# Patient Record
Sex: Male | Born: 1960 | Race: White | Hispanic: No | Smoking: Former smoker
Health system: Southern US, Community
[De-identification: ages and names within clinical notes are randomized; demographics above are authoritative.]

## PROBLEM LIST (undated history)

## (undated) DIAGNOSIS — J189 Pneumonia, unspecified organism: Secondary | ICD-10-CM

## (undated) DIAGNOSIS — M1990 Unspecified osteoarthritis, unspecified site: Secondary | ICD-10-CM

## (undated) DIAGNOSIS — G839 Paralytic syndrome, unspecified: Secondary | ICD-10-CM

## (undated) DIAGNOSIS — Z8489 Family history of other specified conditions: Secondary | ICD-10-CM

## (undated) DIAGNOSIS — I499 Cardiac arrhythmia, unspecified: Secondary | ICD-10-CM

## (undated) DIAGNOSIS — Z87442 Personal history of urinary calculi: Secondary | ICD-10-CM

## (undated) HISTORY — PX: FRACTURE SURGERY: SHX138

---
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
## 1981-07-08 HISTORY — PX: BACK SURGERY: SHX140

## 2019-01-06 DIAGNOSIS — I499 Cardiac arrhythmia, unspecified: Secondary | ICD-10-CM

## 2019-01-06 HISTORY — DX: Cardiac arrhythmia, unspecified: I49.9

## 2019-01-27 ENCOUNTER — Ambulatory Visit (INDEPENDENT_AMBULATORY_CARE_PROVIDER_SITE_OTHER): Payer: BC Managed Care – PPO | Admitting: Orthopaedic Surgery

## 2019-01-27 ENCOUNTER — Other Ambulatory Visit: Payer: Self-pay

## 2019-01-27 VITALS — Ht 70.75 in | Wt 242.0 lb

## 2019-01-27 DIAGNOSIS — M25551 Pain in right hip: Secondary | ICD-10-CM | POA: Diagnosis not present

## 2019-01-27 DIAGNOSIS — M1611 Unilateral primary osteoarthritis, right hip: Secondary | ICD-10-CM

## 2019-01-27 NOTE — Progress Notes (Signed)
Office Visit Note   Patient: Brandon Farley           Date of Birth: 11-24-1960           MRN: [id number] Visit Date: 01/27/2019              Requested by: No referring provider defined for this encounter. PCP: Patient, No Pcp Per   Assessment & Plan: Visit Diagnoses:  1. Pain of right hip joint   2. Unilateral primary osteoarthritis, right hip     Plan: Given his x-ray findings combined with his clinical exam I as well have recommended total hip arthroplasty.  I explained hip replacement through a direct anterior approach.  We talked about the risk and benefits of surgery.  I gave him a handout about the surgery.  Showed him a hip model and talked about his intraoperative and postoperative course.  We had a long and thorough discussion about this.  All question concerns were answered and addressed.  He said he would like our surgery scheduler's card and will give us a call when he would like us to hopefully have this rescheduled.  Follow-Up Instructions: Return if symptoms worsen or fail to improve.   Orders:  No orders of the defined types were placed in this encounter.  No orders of the defined types were placed in this encounter.     Procedures: No procedures performed   Clinical Data: No additional findings.   Subjective: Chief Complaint  Patient presents with   Right Hip - Pain    2nd opinion. Brought xray cd and records. Discuss anterior THA vs posterior approach.  The patient comes in today for second opinion as it relates to severe arthritis in his right hip.  He has been [place] in the state and was told he needed hip replacement.  That surgeon wanted to do this through a posterior approach.  However he would like to consider anterior hip surgery.  His pain in his right hip is daily and it is in the groin.  It is detriment affecting his mobility, his quality of life and his activities of daily living.  This is been worsening since he fell on that hip in  2018.  He is sought conservative treatment for a while but over a years worth of conservative treatment has not helped.  This includes hip strengthening exercises as well as therapy and an injection and anti-inflammatories.  He is work on weight loss and activity modification.  He does have x-rays that accompany him today as well as notes from the orthopedic surgeon in [facility] who did appropriately recommend hip placement surgery.  He does have a history of high blood pressure.  He denies smoking and is not a diabetic.  He is on blood pressure medication.  HPI  Review of Systems He currently denies any headache, chest pain, shortness of breath, fever, chills, nausea, vomiting  Objective: Vital Signs: Ht 5' 10.75" (1.797 m)    Wt 242 lb (109.8 kg)    BMI 33.99 kg/m   Physical Exam He is alert and oriented x3 and in no acute distress Ortho Exam Examination of his left hip is normal examination of his right hip shows severe stiffness and significant pain with internal and external rotation. Specialty Comments:  No specialty comments available.  Imaging: No results found. X-rays that accompany him of his right hip show severe end-stage arthritis.  There is complete loss of the joint space with cystic  changes in the femoral head and acetabulum.  There is flattening of the femoral head and periarticular osteophytes.  PMFS History: Patient Active Problem List   Diagnosis Date Noted   Unilateral primary osteoarthritis, right hip 01/27/2019   No past medical history on file.  No family history on file.   Social History   Occupational History   Not on file  Tobacco Use   Smoking status: Not on file  Substance and Sexual Activity   Alcohol use: Not on file   Drug use: Not on file   Sexual activity: Not on file

## 2019-02-22 ENCOUNTER — Other Ambulatory Visit: Payer: Self-pay

## 2019-02-22 ENCOUNTER — Other Ambulatory Visit: Payer: Self-pay | Admitting: Physician Assistant

## 2019-02-26 NOTE — Patient Instructions (Addendum)
YOU NEED TO HAVE A COVID 19 TEST ON_Tuesday 8/25______ @__12 :05_____, THIS TEST MUST BE DONE BEFORE SURGERY, COME  [address] [place] , [address]. ONCE YOUR COVID TEST IS COMPLETED, PLEASE BEGIN THE QUARANTINE INSTRUCTIONS AS OUTLINED IN YOUR HANDOUT.                Brandon Farley   Your procedure is scheduled on: Friday 03/05/19   Report to [facility] Main  Entrance  Report to admitting at 9:45 AM   1 VISITOR IS ALLOWED TO WAIT IN WAITING ROOM  ONLY DAY OF YOUR SURGERY.  NO VISITORS ARE ALLOWED IN SHORT STAY OR RECOVERY ROOM.   Call this number if you have problems the morning of surgery [phone]   . BRUSH YOUR TEETH MORNING OF SURGERY AND RINSE YOUR MOUTH OUT, NO CHEWING GUM CANDY OR MINTS.   Do not eat food After Midnight.  YOU MAY HAVE CLEAR LIQUIDS FROM MIDNIGHT UNTIL 4:30AM.   At 4:30AM Please finish the prescribed Pre-Surgery Gatorade drink.   Nothing by mouth after you finish the Gatorade drink !   Take these medicines the morning of surgery with A SIP OF WATER: none                                You may not have any metal on your body including piercings   Do not wear jewelry,, lotions, powders or, deodorant                        Men may shave face and neck.rgery.    Do not bring valuables to the hospital. [place].  Contacts, dentures or bridgework may not be worn into surgery.       Special Instructions: N/A              Please read over the following fact sheets you were given: _____________________________________________________________________             [facility] - Preparing for Surgery  Before surgery, you can play an important role.   Because skin is not sterile, your skin needs to be as free of germs as possible.   You can reduce the number of germs on your skin by washing with CHG (chlorahexidine gluconate) soap before surgery.   CHG is an antiseptic cleaner  which kills germs and bonds with the skin to continue killing germs even after washing. Please DO NOT use if you have an allergy to CHG or antibacterial soaps.   If your skin becomes reddened/irritated stop using the CHG and inform your nurse when you arrive at Short Stay. .  You may shave your face/neck. Please follow these instructions carefully:  1.  Shower with CHG Soap the night before surgery and the  morning of Surgery.  2.  If you choose to wash your hair, wash your hair first as usual with your  normal  shampoo.  3.  After you shampoo, rinse your hair and body thoroughly to remove the  shampoo.                                        4.  Use CHG as you would  any other liquid soap.  You can apply chg directly  to the skin and wash                       Gently with a scrungie or clean washcloth.  5.  Apply the CHG Soap to your body ONLY FROM THE NECK DOWN.   Do not use on face/ open                           Wound or open sores. Avoid contact with eyes, ears mouth and genitals (private parts).                       Wash face,  Genitals (private parts) with your normal soap.             6.  Wash thoroughly, paying special attention to the area where your surgery  will be performed.  7.  Thoroughly rinse your body with warm water from the neck down.  8.  DO NOT shower/wash with your normal soap after using and rinsing off  the CHG Soap.             9.  Pat yourself dry with a clean towel.            10.  Wear clean pajamas.            11.  Place clean sheets on your bed the night of your first shower and do not  sleep with pets. Day of Surgery : Do not apply any lotions/deodorants the morning of surgery.  Please wear clean clothes to the hospital/surgery center.  FAILURE TO FOLLOW THESE INSTRUCTIONS MAY RESULT IN THE CANCELLATION OF YOUR SURGERY PATIENT SIGNATURE_________________________________  NURSE  SIGNATURE__________________________________  ________________________________________________________________________   Brandon Farley  An incentive spirometer is a tool that can help keep your lungs clear and active. This tool measures how well you are filling your lungs with each breath. Taking long deep breaths may help reverse or decrease the chance of developing breathing (pulmonary) problems (especially infection) following:  A long period of time when you are unable to move or be active. BEFORE THE PROCEDURE   If the spirometer includes an indicator to show your best effort, your nurse or respiratory therapist will set it to a desired goal.  If possible, sit up straight or lean slightly forward. Try not to slouch.  Hold the incentive spirometer in an upright position. INSTRUCTIONS FOR USE  1. Sit on the edge of your bed if possible, or sit up as far as you can in bed or on a chair. 2. Hold the incentive spirometer in an upright position. 3. Breathe out normally. 4. Place the mouthpiece in your mouth and seal your lips tightly around it. 5. Breathe in slowly and as deeply as possible, raising the piston or the ball toward the top of the column. 6. Hold your breath for 3-5 seconds or for as long as possible. Allow the piston or ball to fall to the bottom of the column. 7. Remove the mouthpiece from your mouth and breathe out normally. 8. Rest for a few seconds and repeat Steps 1 through 7 at least 10 times every 1-2 hours when you are awake. Take your time and take a few normal breaths between deep breaths. 9. The spirometer may include an indicator to show your best effort. Use the indicator as a goal to  work toward during each repetition. 10. After each set of 10 deep breaths, practice coughing to be sure your lungs are clear. If you have an incision (the cut made at the time of surgery), support your incision when coughing by placing a pillow or rolled up towels firmly  against it. Once you are able to get out of bed, walk around indoors and cough well. You may stop using the incentive spirometer when instructed by your caregiver.  RISKS AND COMPLICATIONS  Take your time so you do not get dizzy or light-headed.  If you are in pain, you may need to take or ask for pain medication before doing incentive spirometry. It is harder to take a deep breath if you are having pain. AFTER USE  Rest and breathe slowly and easily.  It can be helpful to keep track of a log of your progress. Your caregiver can provide you with a simple table to help with this. If you are using the spirometer at home, follow these instructions: [place] IF:   You are having difficultly using the spirometer.  You have trouble using the spirometer as often as instructed.  Your pain medication is not giving enough relief while using the spirometer.  You develop fever of 100.5 F (38.1 C) or higher. SEEK IMMEDIATE MEDICAL CARE IF:   You cough up bloody sputum that had not been present before.  You develop fever of 102 F (38.9 C) or greater.  You develop worsening pain at or near the incision site. MAKE SURE YOU:   Understand these instructions.  Will watch your condition.  Will get help right away if you are not doing well or get worse. Document Released: 11/04/2006 Document Revised: 09/16/2011 Document Reviewed: 01/05/2007 [facility] Patient Information 2014 [place], [place].   ________________________________________________________________________

## 2019-03-01 ENCOUNTER — Encounter (HOSPITAL_COMMUNITY)
Admission: RE | Admit: 2019-03-01 | Discharge: 2019-03-01 | Disposition: A | Discharge status: Home / Self Care | Payer: BC Managed Care – PPO | Source: Ambulatory Visit | Attending: Orthopaedic Surgery | Admitting: Orthopaedic Surgery

## 2019-03-01 ENCOUNTER — Encounter (HOSPITAL_COMMUNITY): Payer: Self-pay

## 2019-03-01 ENCOUNTER — Other Ambulatory Visit: Payer: Self-pay

## 2019-03-01 DIAGNOSIS — Z01812 Encounter for preprocedural laboratory examination: Secondary | ICD-10-CM | POA: Diagnosis present

## 2019-03-01 DIAGNOSIS — M1611 Unilateral primary osteoarthritis, right hip: Secondary | ICD-10-CM | POA: Diagnosis not present

## 2019-03-01 HISTORY — DX: Paralytic syndrome, unspecified: G83.9

## 2019-03-01 HISTORY — DX: Family history of other specified conditions: Z84.89

## 2019-03-01 HISTORY — DX: Pneumonia, unspecified organism: J18.9

## 2019-03-01 HISTORY — DX: Unspecified osteoarthritis, unspecified site: M19.90

## 2019-03-01 HISTORY — DX: Personal history of urinary calculi: Z87.442

## 2019-03-01 LAB — CBC
HCT: 46.1 % (ref 39.0–52.0)
Hemoglobin: 14.9 g/dL (ref 13.0–17.0)
MCH: 29.1 pg (ref 26.0–34.0)
MCHC: 32.3 g/dL (ref 30.0–36.0)
MCV: 90 fL (ref 80.0–100.0)
Platelets: 263 10*3/uL (ref 150–400)
RBC: 5.12 MIL/uL (ref 4.22–5.81)
RDW: 13.1 % (ref 11.5–15.5)
WBC: 7.9 10*3/uL (ref 4.0–10.5)
nRBC: 0 % (ref 0.0–0.2)

## 2019-03-01 LAB — SURGICAL PCR SCREEN
MRSA, PCR: NEGATIVE
Staphylococcus aureus: NEGATIVE

## 2019-03-01 NOTE — Progress Notes (Signed)
PCP - Dr. Deland Pretty Cardiologist - none  Chest x-ray - NA EKG - NA Stress Test -no  ECHO - no Cardiac Cath - no  Sleep Study - no CPAP -   Fasting Blood Sugar -  Checks Blood Sugar _____ times a day  Blood Thinner Instructions:Dr. Blackman's office Aspirin Instructions: Last Dose:8/21  Anesthesia review:   Patient denies shortness of breath, fever, cough and chest pain at PAT appointmentno   Patient verbalized understanding of instructions that were given to them at the PAT appointment. Patient was also instructed that they will need to review over the PAT instructions again at home before surgery.yes

## 2019-03-02 ENCOUNTER — Other Ambulatory Visit (HOSPITAL_COMMUNITY)
Admission: RE | Admit: 2019-03-02 | Discharge: 2019-03-02 | Disposition: A | Discharge status: Home / Self Care | Payer: BC Managed Care – PPO | Source: Ambulatory Visit | Attending: Orthopaedic Surgery | Admitting: Orthopaedic Surgery

## 2019-03-02 DIAGNOSIS — Z01812 Encounter for preprocedural laboratory examination: Secondary | ICD-10-CM | POA: Insufficient documentation

## 2019-03-02 DIAGNOSIS — Z20828 Contact with and (suspected) exposure to other viral communicable diseases: Secondary | ICD-10-CM | POA: Diagnosis not present

## 2019-03-02 DIAGNOSIS — M1611 Unilateral primary osteoarthritis, right hip: Secondary | ICD-10-CM | POA: Insufficient documentation

## 2019-03-02 LAB — SARS CORONAVIRUS 2 (TAT 6-24 HRS): SARS Coronavirus 2: NEGATIVE

## 2019-03-04 NOTE — Anesthesia Preprocedure Evaluation (Addendum)
Anesthesia Evaluation  Patient identified by MRN, date of birth, ID band Patient awake    Reviewed: Allergy & Precautions, NPO status , Patient's Chart, lab work & pertinent test results  Airway Mallampati: I       Dental no notable dental hx. (+) Teeth Intact   Pulmonary former smoker,    Pulmonary exam normal breath sounds clear to auscultation       Cardiovascular negative cardio ROS Normal cardiovascular exam Rhythm:Regular Rate:Normal     Neuro/Psych negative neurological ROS  negative psych ROS   GI/Hepatic negative GI ROS, Neg liver ROS,   Endo/Other  negative endocrine ROS  Renal/GU negative Renal ROS  negative genitourinary   Musculoskeletal  (+) Arthritis , Osteoarthritis,    Abdominal   Peds  Hematology negative hematology ROS (+)   Anesthesia Other Findings   Reproductive/Obstetrics                            Anesthesia Physical Anesthesia Plan  ASA: II  Anesthesia Plan: Spinal   Post-op Pain Management:    Induction:   PONV Risk Score and Plan: 2 and Ondansetron, Dexamethasone and Midazolam  Airway Management Planned: Natural Airway and Nasal Cannula  Additional Equipment:   Intra-op Plan:   Post-operative Plan:   Informed Consent: I have reviewed the patients History and Physical, chart, labs and discussed the procedure including the risks, benefits and alternatives for the proposed anesthesia with the patient or authorized representative who has indicated his/her understanding and acceptance.     Dental advisory given  Plan Discussed with: CRNA  Anesthesia Plan Comments:        Anesthesia Quick Evaluation

## 2019-03-05 ENCOUNTER — Inpatient Hospital Stay (HOSPITAL_COMMUNITY): Payer: BC Managed Care – PPO | Admitting: Physician Assistant

## 2019-03-05 ENCOUNTER — Inpatient Hospital Stay (HOSPITAL_COMMUNITY): Payer: BC Managed Care – PPO

## 2019-03-05 ENCOUNTER — Encounter (HOSPITAL_COMMUNITY)
Admission: RE | Disposition: A | Discharge status: Home Health | Payer: Self-pay | Source: Home / Self Care | Attending: Orthopaedic Surgery

## 2019-03-05 ENCOUNTER — Other Ambulatory Visit: Payer: Self-pay

## 2019-03-05 ENCOUNTER — Inpatient Hospital Stay (HOSPITAL_COMMUNITY): Payer: BC Managed Care – PPO | Admitting: Anesthesiology

## 2019-03-05 ENCOUNTER — Encounter (HOSPITAL_COMMUNITY): Payer: Self-pay | Admitting: *Deleted

## 2019-03-05 ENCOUNTER — Inpatient Hospital Stay (HOSPITAL_COMMUNITY)
Admission: RE | Admit: 2019-03-05 | Discharge: 2019-03-06 | DRG: 470 | Disposition: A | Discharge status: Home Health | Payer: BC Managed Care – PPO | Attending: Orthopaedic Surgery | Admitting: Orthopaedic Surgery

## 2019-03-05 DIAGNOSIS — M25451 Effusion, right hip: Secondary | ICD-10-CM | POA: Diagnosis present

## 2019-03-05 DIAGNOSIS — Z87442 Personal history of urinary calculi: Secondary | ICD-10-CM | POA: Diagnosis not present

## 2019-03-05 DIAGNOSIS — Z87891 Personal history of nicotine dependence: Secondary | ICD-10-CM

## 2019-03-05 DIAGNOSIS — Z96641 Presence of right artificial hip joint: Secondary | ICD-10-CM

## 2019-03-05 DIAGNOSIS — M1611 Unilateral primary osteoarthritis, right hip: Secondary | ICD-10-CM | POA: Diagnosis present

## 2019-03-05 DIAGNOSIS — Z419 Encounter for procedure for purposes other than remedying health state, unspecified: Secondary | ICD-10-CM

## 2019-03-05 HISTORY — PX: TOTAL HIP ARTHROPLASTY: SHX124

## 2019-03-05 SURGERY — ARTHROPLASTY, HIP, TOTAL, ANTERIOR APPROACH
Anesthesia: Spinal | Site: Hip | Laterality: Right

## 2019-03-05 MED ORDER — SODIUM CHLORIDE 0.9 % IR SOLN
Status: DC | PRN
  Administered 2019-03-05: 1000 mL

## 2019-03-05 MED ORDER — CEFAZOLIN SODIUM-DEXTROSE 1-4 GM/50ML-% IV SOLN
1.0000 g | Freq: Four times a day (QID) | INTRAVENOUS | Status: AC
  Administered 2019-03-05 (×2): 1 g via INTRAVENOUS
  Filled 2019-03-05 (×2): qty 50

## 2019-03-05 MED ORDER — ONDANSETRON HCL 4 MG/2ML IJ SOLN
INTRAMUSCULAR | Status: AC
  Filled 2019-03-05: qty 2

## 2019-03-05 MED ORDER — PROMETHAZINE HCL 25 MG/ML IJ SOLN: 6.2500 mg | INTRAMUSCULAR | Status: DC | PRN

## 2019-03-05 MED ORDER — GABAPENTIN 100 MG PO CAPS
100.0000 mg | ORAL_CAPSULE | Freq: Three times a day (TID) | ORAL | Status: DC
  Administered 2019-03-05 – 2019-03-06 (×4): 100 mg via ORAL
  Filled 2019-03-05 (×4): qty 1

## 2019-03-05 MED ORDER — HYDROMORPHONE HCL 1 MG/ML IJ SOLN
0.2500 mg | INTRAMUSCULAR | Status: DC | PRN
  Administered 2019-03-05 (×4): 0.5 mg via INTRAVENOUS

## 2019-03-05 MED ORDER — FENTANYL CITRATE (PF) 100 MCG/2ML IJ SOLN
INTRAMUSCULAR | Status: DC | PRN
  Administered 2019-03-05 (×5): 50 ug via INTRAVENOUS

## 2019-03-05 MED ORDER — ROCURONIUM BROMIDE 10 MG/ML (PF) SYRINGE
PREFILLED_SYRINGE | INTRAVENOUS | Status: AC
  Filled 2019-03-05: qty 10

## 2019-03-05 MED ORDER — TRANEXAMIC ACID-NACL 1000-0.7 MG/100ML-% IV SOLN
INTRAVENOUS | Status: AC
  Filled 2019-03-05: qty 100

## 2019-03-05 MED ORDER — PANTOPRAZOLE SODIUM 40 MG PO TBEC
40.0000 mg | DELAYED_RELEASE_TABLET | Freq: Every day | ORAL | Status: DC
  Administered 2019-03-05 – 2019-03-06 (×2): 40 mg via ORAL
  Filled 2019-03-05 (×2): qty 1

## 2019-03-05 MED ORDER — ONDANSETRON HCL 4 MG/2ML IJ SOLN
INTRAMUSCULAR | Status: DC | PRN
  Administered 2019-03-05: 4 mg via INTRAVENOUS

## 2019-03-05 MED ORDER — SODIUM CHLORIDE 0.9 % IV SOLN: INTRAVENOUS | Status: DC

## 2019-03-05 MED ORDER — FENTANYL CITRATE (PF) 100 MCG/2ML IJ SOLN
INTRAMUSCULAR | Status: AC
  Filled 2019-03-05: qty 2

## 2019-03-05 MED ORDER — POVIDONE-IODINE 10 % EX SWAB
2.0000 "application " | Freq: Once | CUTANEOUS | Status: AC
  Administered 2019-03-05: 2 via TOPICAL

## 2019-03-05 MED ORDER — DEXAMETHASONE SODIUM PHOSPHATE 4 MG/ML IJ SOLN
INTRAMUSCULAR | Status: DC | PRN
  Administered 2019-03-05: 10 mg via INTRAVENOUS

## 2019-03-05 MED ORDER — DIPHENHYDRAMINE HCL 12.5 MG/5ML PO ELIX: 12.5000 mg | ORAL_SOLUTION | ORAL | Status: DC | PRN

## 2019-03-05 MED ORDER — LACTATED RINGERS IV SOLN
INTRAVENOUS | Status: DC
  Administered 2019-03-05 (×2): via INTRAVENOUS

## 2019-03-05 MED ORDER — ONDANSETRON HCL 4 MG/2ML IJ SOLN: 4.0000 mg | Freq: Four times a day (QID) | INTRAMUSCULAR | Status: DC | PRN

## 2019-03-05 MED ORDER — STERILE WATER FOR IRRIGATION IR SOLN
Status: DC | PRN
  Administered 2019-03-05 (×2): 1000 mL

## 2019-03-05 MED ORDER — METHOCARBAMOL 500 MG PO TABS
500.0000 mg | ORAL_TABLET | Freq: Four times a day (QID) | ORAL | Status: DC | PRN
  Administered 2019-03-05 – 2019-03-06 (×2): 500 mg via ORAL
  Filled 2019-03-05 (×2): qty 1

## 2019-03-05 MED ORDER — ZOLPIDEM TARTRATE 5 MG PO TABS: 5.0000 mg | ORAL_TABLET | Freq: Every evening | ORAL | Status: DC | PRN

## 2019-03-05 MED ORDER — HYDROMORPHONE HCL 1 MG/ML IJ SOLN
INTRAMUSCULAR | Status: AC
  Filled 2019-03-05: qty 1

## 2019-03-05 MED ORDER — CEFAZOLIN SODIUM-DEXTROSE 2-4 GM/100ML-% IV SOLN
2.0000 g | INTRAVENOUS | Status: AC
  Administered 2019-03-05: 07:00:00 2 g via INTRAVENOUS

## 2019-03-05 MED ORDER — MEPERIDINE HCL 50 MG/ML IJ SOLN: 6.2500 mg | INTRAMUSCULAR | Status: DC | PRN

## 2019-03-05 MED ORDER — MIDAZOLAM HCL 2 MG/2ML IJ SOLN
INTRAMUSCULAR | Status: AC
  Filled 2019-03-05: qty 2

## 2019-03-05 MED ORDER — MENTHOL 3 MG MT LOZG: 1.0000 | LOZENGE | OROMUCOSAL | Status: DC | PRN

## 2019-03-05 MED ORDER — TRANEXAMIC ACID-NACL 1000-0.7 MG/100ML-% IV SOLN
1000.0000 mg | INTRAVENOUS | Status: AC
  Administered 2019-03-05: 1000 mg via INTRAVENOUS

## 2019-03-05 MED ORDER — ASPIRIN 81 MG PO CHEW
81.0000 mg | CHEWABLE_TABLET | Freq: Two times a day (BID) | ORAL | Status: DC
  Administered 2019-03-05 – 2019-03-06 (×2): 81 mg via ORAL
  Filled 2019-03-05 (×2): qty 1

## 2019-03-05 MED ORDER — HYDROMORPHONE HCL 1 MG/ML IJ SOLN
INTRAMUSCULAR | Status: DC | PRN
  Administered 2019-03-05 (×4): 0.5 mg via INTRAVENOUS

## 2019-03-05 MED ORDER — ALUM & MAG HYDROXIDE-SIMETH 200-200-20 MG/5ML PO SUSP: 30.0000 mL | ORAL | Status: DC | PRN

## 2019-03-05 MED ORDER — METOCLOPRAMIDE HCL 5 MG PO TABS: 5.0000 mg | ORAL_TABLET | Freq: Three times a day (TID) | ORAL | Status: DC | PRN

## 2019-03-05 MED ORDER — ONDANSETRON HCL 4 MG PO TABS: 4.0000 mg | ORAL_TABLET | Freq: Four times a day (QID) | ORAL | Status: DC | PRN

## 2019-03-05 MED ORDER — SUGAMMADEX SODIUM 200 MG/2ML IV SOLN
INTRAVENOUS | Status: DC | PRN
  Administered 2019-03-05: 200 mg via INTRAVENOUS

## 2019-03-05 MED ORDER — FENTANYL CITRATE (PF) 250 MCG/5ML IJ SOLN
INTRAMUSCULAR | Status: AC
  Filled 2019-03-05: qty 5

## 2019-03-05 MED ORDER — HYDROMORPHONE HCL 1 MG/ML IJ SOLN: 0.5000 mg | INTRAMUSCULAR | Status: DC | PRN

## 2019-03-05 MED ORDER — POLYETHYLENE GLYCOL 3350 17 G PO PACK: 17.0000 g | PACK | Freq: Every day | ORAL | Status: DC | PRN

## 2019-03-05 MED ORDER — ALBUMIN HUMAN 5 % IV SOLN
INTRAVENOUS | Status: DC | PRN
  Administered 2019-03-05: 09:00:00 via INTRAVENOUS

## 2019-03-05 MED ORDER — DEXAMETHASONE SODIUM PHOSPHATE 10 MG/ML IJ SOLN
INTRAMUSCULAR | Status: AC
  Filled 2019-03-05: qty 1

## 2019-03-05 MED ORDER — DOCUSATE SODIUM 100 MG PO CAPS
100.0000 mg | ORAL_CAPSULE | Freq: Two times a day (BID) | ORAL | Status: DC
  Administered 2019-03-05 – 2019-03-06 (×2): 100 mg via ORAL
  Filled 2019-03-05 (×2): qty 1

## 2019-03-05 MED ORDER — PROPOFOL 10 MG/ML IV BOLUS
INTRAVENOUS | Status: AC
  Filled 2019-03-05: qty 40

## 2019-03-05 MED ORDER — METHOCARBAMOL 500 MG IVPB - SIMPLE MED
500.0000 mg | Freq: Four times a day (QID) | INTRAVENOUS | Status: DC | PRN
  Administered 2019-03-05: 10:00:00 500 mg via INTRAVENOUS
  Filled 2019-03-05: qty 50

## 2019-03-05 MED ORDER — LIDOCAINE HCL (CARDIAC) PF 100 MG/5ML IV SOSY
PREFILLED_SYRINGE | INTRAVENOUS | Status: DC | PRN
  Administered 2019-03-05: 30 mg via INTRAVENOUS

## 2019-03-05 MED ORDER — ROCURONIUM BROMIDE 100 MG/10ML IV SOLN
INTRAVENOUS | Status: DC | PRN
  Administered 2019-03-05: 10 mg via INTRAVENOUS
  Administered 2019-03-05: 50 mg via INTRAVENOUS
  Administered 2019-03-05: 20 mg via INTRAVENOUS

## 2019-03-05 MED ORDER — PROPOFOL 10 MG/ML IV BOLUS
INTRAVENOUS | Status: DC | PRN
  Administered 2019-03-05: 20 mg via INTRAVENOUS
  Administered 2019-03-05: 210 mg via INTRAVENOUS

## 2019-03-05 MED ORDER — METHOCARBAMOL 500 MG IVPB - SIMPLE MED
INTRAVENOUS | Status: AC
  Filled 2019-03-05: qty 50

## 2019-03-05 MED ORDER — OXYCODONE HCL 5 MG PO TABS: 10.0000 mg | ORAL_TABLET | ORAL | Status: DC | PRN

## 2019-03-05 MED ORDER — EPHEDRINE SULFATE 50 MG/ML IJ SOLN
INTRAMUSCULAR | Status: DC | PRN
  Administered 2019-03-05 (×2): 5 mg via INTRAVENOUS

## 2019-03-05 MED ORDER — HYDROMORPHONE HCL 2 MG/ML IJ SOLN
INTRAMUSCULAR | Status: AC
  Filled 2019-03-05: qty 1

## 2019-03-05 MED ORDER — CEFAZOLIN SODIUM-DEXTROSE 2-4 GM/100ML-% IV SOLN
INTRAVENOUS | Status: AC
  Filled 2019-03-05: qty 100

## 2019-03-05 MED ORDER — ALBUMIN HUMAN 5 % IV SOLN
INTRAVENOUS | Status: AC
  Filled 2019-03-05: qty 250

## 2019-03-05 MED ORDER — KETOROLAC TROMETHAMINE 30 MG/ML IJ SOLN: 30.0000 mg | Freq: Once | INTRAMUSCULAR | Status: DC | PRN

## 2019-03-05 MED ORDER — ACETAMINOPHEN 325 MG PO TABS: 325.0000 mg | ORAL_TABLET | Freq: Four times a day (QID) | ORAL | Status: DC | PRN

## 2019-03-05 MED ORDER — OXYCODONE HCL 5 MG PO TABS
5.0000 mg | ORAL_TABLET | ORAL | Status: DC | PRN
  Administered 2019-03-05: 21:00:00 10 mg via ORAL
  Administered 2019-03-05 (×2): 5 mg via ORAL
  Administered 2019-03-06 (×2): 10 mg via ORAL
  Filled 2019-03-05: qty 2
  Filled 2019-03-05: qty 1
  Filled 2019-03-05 (×3): qty 2

## 2019-03-05 MED ORDER — PHENOL 1.4 % MT LIQD
1.0000 | OROMUCOSAL | Status: DC | PRN
  Filled 2019-03-05: qty 177

## 2019-03-05 MED ORDER — LIDOCAINE 2% (20 MG/ML) 5 ML SYRINGE
INTRAMUSCULAR | Status: AC
  Filled 2019-03-05: qty 5

## 2019-03-05 MED ORDER — METOCLOPRAMIDE HCL 5 MG/ML IJ SOLN: 5.0000 mg | Freq: Three times a day (TID) | INTRAMUSCULAR | Status: DC | PRN

## 2019-03-05 MED ORDER — MIDAZOLAM HCL 5 MG/5ML IJ SOLN
INTRAMUSCULAR | Status: DC | PRN
  Administered 2019-03-05: 2 mg via INTRAVENOUS

## 2019-03-05 MED ORDER — CHLORHEXIDINE GLUCONATE 4 % EX LIQD: 60.0000 mL | Freq: Once | CUTANEOUS | Status: DC

## 2019-03-05 SURGICAL SUPPLY — 46 items
BAG ZIPLOCK 12X15 (MISCELLANEOUS) IMPLANT
BENZOIN TINCTURE PRP APPL 2/3 (GAUZE/BANDAGES/DRESSINGS) ×2 IMPLANT
BLADE SAW SGTL 18X1.27X75 (BLADE) ×2 IMPLANT
BLADE SAW SGTL 18X1.27X75MM (BLADE) ×1
BLADE SURG SZ10 CARB STEEL (BLADE) ×6 IMPLANT
CLOSURE STERI-STRIP 1/2X4 (GAUZE/BANDAGES/DRESSINGS) ×1
CLOSURE WOUND 1/2 X4 (GAUZE/BANDAGES/DRESSINGS)
CLSR STERI-STRIP ANTIMIC 1/2X4 (GAUZE/BANDAGES/DRESSINGS) ×1 IMPLANT
COVER PERINEAL POST (MISCELLANEOUS) ×3 IMPLANT
COVER SURGICAL LIGHT HANDLE (MISCELLANEOUS) ×3 IMPLANT
COVER WAND RF STERILE (DRAPES) IMPLANT
CUP ACET PINNACLE SECTR 60MM (Hips) IMPLANT
DRAPE STERI IOBAN 125X83 (DRAPES) ×3 IMPLANT
DRAPE U-SHAPE 47X51 STRL (DRAPES) ×6 IMPLANT
DRESSING AQUACEL AG SP 3.5X10 (GAUZE/BANDAGES/DRESSINGS) IMPLANT
DRSG AQUACEL AG ADV 3.5X10 (GAUZE/BANDAGES/DRESSINGS) ×1 IMPLANT
DRSG AQUACEL AG SP 3.5X10 (GAUZE/BANDAGES/DRESSINGS) ×3
DURAPREP 26ML APPLICATOR (WOUND CARE) ×3 IMPLANT
ELECT REM PT RETURN 15FT ADLT (MISCELLANEOUS) ×3 IMPLANT
GAUZE XEROFORM 1X8 LF (GAUZE/BANDAGES/DRESSINGS) ×1 IMPLANT
GLOVE BIO SURGEON STRL SZ7.5 (GLOVE) ×3 IMPLANT
GLOVE BIOGEL PI IND STRL 8 (GLOVE) ×2 IMPLANT
GLOVE BIOGEL PI INDICATOR 8 (GLOVE) ×4
GLOVE ECLIPSE 8.0 STRL XLNG CF (GLOVE) ×3 IMPLANT
GOWN STRL REUS W/TWL XL LVL3 (GOWN DISPOSABLE) ×6 IMPLANT
HANDPIECE INTERPULSE COAX TIP (DISPOSABLE) ×2
HEAD CERAMIC 36 PLUS 8.5 12 14 (Hips) ×2 IMPLANT
HOLDER FOLEY CATH W/STRAP (MISCELLANEOUS) ×3 IMPLANT
KIT TURNOVER KIT A (KITS) IMPLANT
LINER PINN ALTRX ACTABR 36X60 (Liner) IMPLANT
LINER PINNACLE ALTRAX ACTABULR (Liner) ×2 IMPLANT
PACK ANTERIOR HIP CUSTOM (KITS) ×3 IMPLANT
PINNSECTOR W/GRIP ACE CUP 60MM (Hips) ×3 IMPLANT
SET HNDPC FAN SPRY TIP SCT (DISPOSABLE) ×1 IMPLANT
STAPLER VISISTAT 35W (STAPLE) IMPLANT
STEM FEM ACTIS HIGH SZ8 (Stem) ×2 IMPLANT
STRIP CLOSURE SKIN 1/2X4 (GAUZE/BANDAGES/DRESSINGS) IMPLANT
SUT ETHIBOND NAB CT1 #1 30IN (SUTURE) ×3 IMPLANT
SUT ETHILON 2 0 PS N (SUTURE) IMPLANT
SUT MNCRL AB 4-0 PS2 18 (SUTURE) IMPLANT
SUT VIC AB 0 CT1 36 (SUTURE) ×3 IMPLANT
SUT VIC AB 1 CT1 36 (SUTURE) ×3 IMPLANT
SUT VIC AB 2-0 CT1 27 (SUTURE) ×4
SUT VIC AB 2-0 CT1 TAPERPNT 27 (SUTURE) ×2 IMPLANT
TRAY FOLEY MTR SLVR 16FR STAT (SET/KITS/TRAYS/PACK) ×1 IMPLANT
YANKAUER SUCT BULB TIP 10FT TU (MISCELLANEOUS) ×3 IMPLANT

## 2019-03-05 NOTE — Anesthesia Postprocedure Evaluation (Signed)
Anesthesia Post Note  Patient: Brandon Farley  Procedure(s) Performed: RIGHT TOTAL HIP ARTHROPLASTY ANTERIOR APPROACH (Right Hip)     Patient location during evaluation: PACU Anesthesia Type: General Level of consciousness: awake and sedated Pain management: pain level controlled Vital Signs Assessment: post-procedure vital signs reviewed and stable Respiratory status: spontaneous breathing Cardiovascular status: stable Postop Assessment: no apparent nausea or vomiting Anesthetic complications: no    Last Vitals:  Vitals:   03/05/19 0952 03/05/19 1015  BP:  (!) 143/81  Pulse: 74 69  Resp: (!) 26   Temp:    SpO2: 100% 100%    Last Pain:  Vitals:   03/05/19 1000  TempSrc:   PainSc: 5    Pain Goal: Patients Stated Pain Goal: 4 (03/05/19 0547)  LLE Motor Response: Responds to commands (03/05/19 1000) LLE Sensation: Full sensation (03/05/19 1000) RLE Motor Response: Responds to commands (03/05/19 1000) RLE Sensation: Full sensation (03/05/19 1000)        Huston Foley

## 2019-03-05 NOTE — H&P (Signed)
TOTAL HIP ADMISSION H&P  Patient is admitted for right total hip arthroplasty.  Subjective:  Chief Complaint: right hip pain  HPI: Brandon CornfieldGregory C Farley, 58 y.o. male, has a history of pain and functional disability in the right hip(s) due to arthritis and patient has failed non-surgical conservative treatments for greater than 12 weeks to include NSAID's and/or analgesics, corticosteriod injections and activity modification.  Onset of symptoms was abrupt starting 2 years ago with rapidlly worsening course since that time.The patient noted no past surgery on the right hip(s).  Patient currently rates pain in the right hip at 10 out of 10 with activity. Patient has night pain, worsening of pain with activity and weight bearing, pain that interfers with activities of daily living and pain with passive range of motion. Patient has evidence of subchondral sclerosis, periarticular osteophytes and joint space narrowing by imaging studies. This condition presents safety issues increasing the risk of falls.  There is no current active infection.  Patient Active Problem List   Diagnosis Date Noted  . Unilateral primary osteoarthritis, right hip 01/27/2019   Past Medical History:  Diagnosis Date  . Arthritis   . Dysrhythmia 01/2019   had "Fluttering in chest" while harking hard in the heat  . Family history of adverse reaction to anesthesia    N&V  . History of kidney stones    2009  . 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 Pneumonia    20 years ago    Past Surgical History:  Procedure Laterality Date  . BACK SURGERY  1983   upper back  . FRACTURE SURGERY     rt foot as a child    Current Facility-Administered Medications  Medication Dose Route Frequency Provider Last Rate Last Dose  . ceFAZolin (ANCEF) 2-4 GM/100ML-% IVPB           . ceFAZolin (ANCEF) IVPB 2g/100 mL premix  2 g Intravenous On Call to OR Kirtland Bouchardlark, Gilbert W, PA-C      . chlorhexidine (HIBICLENS) 4 % liquid 4 application  60 mL  Topical Once Kirtland Bouchardlark, Gilbert W, PA-C      . lactated ringers infusion   Intravenous Continuous Leilani AbleHatchett, Franklin, MD 20 mL/hr at 03/05/19 0556    . tranexamic acid (CYKLOKAPRON) 1000MG /15500mL IVPB           . tranexamic acid (CYKLOKAPRON) IVPB 1,000 mg  1,000 mg Intravenous To OR Kirtland Bouchardlark, Gilbert W, PA-C       No Known Allergies  Social History   Tobacco Use  . Smoking status: Former Smoker    Packs/day: 0.50    Years: 15.00    Pack years: 7.50    Types: Cigarettes    Quit date: 03/01/1999    Years since quitting: 20.0  . Smokeless tobacco: Former NeurosurgeonUser    Types: Chew    Quit date: 09/29/2018  Substance Use Topics  . Alcohol use: Yes    Comment: occationally    History reviewed. No pertinent family history.   Review of Systems  Musculoskeletal: Positive for joint pain.  All other systems reviewed and are negative.   Objective:  Physical Exam  Constitutional: He is oriented to person, place, and time. He appears well-developed and well-nourished.  HENT:  Head: Normocephalic and atraumatic.  Eyes: Pupils are equal, round, and reactive to light. EOM are normal.  Neck: Normal range of motion. Neck supple.  Cardiovascular: Normal rate.  Respiratory: Effort normal.  GI: Soft.  Musculoskeletal:  Right hip: He exhibits decreased range of motion, decreased strength, tenderness and bony tenderness.  Neurological: He is alert and oriented to person, place, and time.  Skin: Skin is warm and dry.  Psychiatric: He has a normal mood and affect.    Vital signs in last 24 hours: Temp:  [98.3 F (36.8 C)] 98.3 F (36.8 C) (08/28 0546) Pulse Rate:  [80] 80 (08/28 0546) Resp:  [18] 18 (08/28 0546) BP: (159)/(97) 159/97 (08/28 0546) SpO2:  [99 %] 99 % (08/28 0546) Weight:  [109.3 kg] 109.3 kg (08/28 0547)  Labs:   Estimated body mass index is 33.61 kg/m as calculated from the following:   Height as of this encounter: 5\' 11"  (1.803 m).   Weight as of this encounter: 109.3  kg.   Imaging Review Plain radiographs demonstrate severe degenerative joint disease of the right hip(s). The bone quality appears to be excellent for age and reported activity level.      Assessment/Plan:  End stage arthritis, right hip(s)  The patient history, physical examination, clinical judgement of the provider and imaging studies are consistent with end stage degenerative joint disease of the right hip(s) and total hip arthroplasty is deemed medically necessary. The treatment options including medical management, injection therapy, arthroscopy and arthroplasty were discussed at length. The risks and benefits of total hip arthroplasty were presented and reviewed. The risks due to aseptic loosening, infection, stiffness, dislocation/subluxation,  thromboembolic complications and other imponderables were discussed.  The patient acknowledged the explanation, agreed to proceed with the plan and consent was signed. Patient is being admitted for inpatient treatment for surgery, pain control, PT, OT, prophylactic antibiotics, VTE prophylaxis, progressive ambulation and ADL's and discharge planning.The patient is planning to be discharged home with home health services

## 2019-03-05 NOTE — Plan of Care (Signed)
  Problem: Education: Goal: Knowledge of the prescribed therapeutic regimen will improve 03/05/2019 1458 by Hubert Azure, RN Outcome: Progressing 03/05/2019 1458 by Hubert Azure, RN Outcome: Progressing Goal: Understanding of discharge needs will improve 03/05/2019 1458 by Hubert Azure, RN Outcome: Progressing 03/05/2019 1458 by Hubert Azure, RN Outcome: Progressing Goal: Individualized Educational Video(s) 03/05/2019 1458 by Hubert Azure, RN Outcome: Progressing 03/05/2019 1458 by Hubert Azure, RN Outcome: Progressing   Problem: Activity: Goal: Ability to avoid complications of mobility impairment will improve Outcome: Progressing

## 2019-03-05 NOTE — Brief Op Note (Signed)
03/05/2019  8:52 AM  PATIENT:  Brandon Farley  58 y.o. male  PRE-OPERATIVE DIAGNOSIS:  right hip osteoarthritis  POST-OPERATIVE DIAGNOSIS:  right hip osteoarthritis  PROCEDURE:  Procedure(s): RIGHT TOTAL HIP ARTHROPLASTY ANTERIOR APPROACH (Right)  SURGEON:  Surgeon(s) and Role:    Mcarthur Rossetti, MD - Primary  PHYSICIAN ASSISTANT:  Benita Stabile, PA-C  ANESTHESIA:   spinal and general  EBL:  500 mL   COUNTS:  YES  DICTATION: .Other Dictation: Dictation Number [id number]  PLAN OF CARE: Admit to inpatient   PATIENT DISPOSITION:  PACU - hemodynamically stable.   Delay start of Pharmacological VTE agent (>24hrs) due to surgical blood loss or risk of bleeding: no

## 2019-03-05 NOTE — Anesthesia Procedure Notes (Signed)
Procedure Name: Intubation Date/Time: 03/05/2019 7:49 AM Performed by: Garrel Ridgel, CRNA Pre-anesthesia Checklist: Emergency Drugs available, Suction available, Patient identified, Patient being monitored and Timeout performed Patient Re-evaluated:Patient Re-evaluated prior to induction Oxygen Delivery Method: Circle system utilized Preoxygenation: Pre-oxygenation with 100% oxygen Induction Type: IV induction Ventilation: Mask ventilation without difficulty Laryngoscope Size: Mac and 4 Grade View: Grade II Number of attempts: 1 Airway Equipment and Method: Stylet Placement Confirmation: ETT inserted through vocal cords under direct vision,  positive ETCO2 and breath sounds checked- equal and bilateral Secured at: 22 cm Tube secured with: Tape Dental Injury: Teeth and Oropharynx as per pre-operative assessment

## 2019-03-05 NOTE — Evaluation (Signed)
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  Pt should progress to dc home with family assist and HHPT follow up.    Follow Up Recommendations Home health PT;Follow surgeon's recommendation for DC plan and follow-up therapies    Equipment Recommendations  None recommended by PT    Recommendations for Other Services       Precautions / Restrictions Precautions Precautions: Fall Restrictions Weight Bearing Restrictions: No Other Position/Activity Restrictions: WBAT      Mobility  Bed Mobility Overal bed mobility: Needs Assistance Bed Mobility: Supine to Sit     Supine to sit: Min assist     General bed mobility comments: cues for sequence and use of L LE to self assist  Transfers Overall transfer level: Needs assistance Equipment used: Rolling walker (2 wheeled) Transfers: Sit to/from Stand Sit to Stand: Min assist         General transfer comment: cues for LE management and use of UEs to self assist  Ambulation/Gait Ambulation/Gait assistance: Min assist Gait Distance (Feet): 58 Feet Assistive device: Rolling walker (2 wheeled) Gait Pattern/deviations: Step-to pattern;Decreased step length - right;Decreased step length - left;Shuffle;Trunk flexed Gait velocity: decr   General Gait Details: cues for sequence, posture and position from [employer]  Stairs            Wheelchair Mobility    Modified Rankin (Stroke Patients Only)       Balance Overall balance assessment: Mild deficits observed, not formally tested                                           Pertinent Vitals/Pain Pain Assessment: 0-10 Pain Score: 6   Pain Location: R hip/thigh Pain Descriptors / Indicators: Aching;Burning Pain Intervention(s): Limited activity within patient's tolerance;Monitored during session;Premedicated before session;Ice applied    Home Living Family/patient expects to be discharged to:: Private residence Living Arrangements: Alone;Children Available Help at Discharge: Family;Friend(s);Available 24 hours/day Type of Home: House Home Access: Stairs to enter   Enter[employer] CorporationEntrance Stairs-Number of Steps: 1 Home Layout: One level Home Equipment: Walker - 2 wheels;Bedside commode      Prior Function Level of Independence: Independent               Hand Dominance        Extremity/Trunk Assessment   Upper Extremity Assessment Upper Extremity Assessment: Overall WFL for tasks assessed    Lower Extremity Assessment Lower Extremity Assessment: RLE deficits/detail;LLE deficits/detail RLE Deficits / Details: partial foot drop following back surgery; ROM at hip not assessed LLE Deficits / Details: foot drop 2* previous back surgery    Cervical / Trunk Assessment Cervical / Trunk Assessment: Normal  Communication   Communication: No difficulties  Cognition Arousal/Alertness: Awake/alert Behavior During Therapy: WFL for tasks assessed/performed Overall Cognitive Status: Within Functional Limits for tasks assessed                                        General Comments  Exercises Total Joint Exercises Ankle Circles/Pumps: AROM;Both;20 reps;Supine   Assessment/Plan    PT Assessment Patient needs continued PT services  PT Problem List Decreased strength;Decreased range of motion;Decreased activity tolerance;Decreased mobility;Decreased knowledge of use of DME;Pain       PT Treatment Interventions DME instruction;Gait training;Stair training;Functional mobility training;Therapeutic activities;Therapeutic exercise;Patient/family education    PT Goals (Current goals can be found in  the Care Plan section)  Acute Rehab PT Goals Patient Stated Goal: Regain IND PT Goal Formulation: With patient Time For Goal Achievement: 03/12/19 Potential to Achieve Goals: Good    Frequency 7X/week   Barriers to discharge        Co-evaluation               AM-PAC PT "6 Clicks" Mobility  Outcome Measure Help needed turning from your back to your side while in a flat bed without using bedrails?: A Little Help needed moving from lying on your back to sitting on the side of a flat bed without using bedrails?: A Little Help needed moving to and from a bed to a chair (including a wheelchair)?: A Little Help needed standing up from a chair using your arms (e.g., wheelchair or bedside chair)?: A Little Help needed to walk in hospital room?: A Little Help needed climbing 3-5 steps with a railing? : A Lot 6 Click Score: 17    End of Session Equipment Utilized During Treatment: Gait belt Activity Tolerance: Patient tolerated treatment well Patient left: in chair;with call bell/phone within reach;with chair alarm set;with family/visitor present Nurse Communication: Mobility status PT Visit Diagnosis: Difficulty in walking, not elsewhere classified (R26.2)    Time: 1610-9604 PT Time Calculation (min) (ACUTE ONLY): 24 min   Charges:   PT Evaluation $PT Eval Low Complexity: 1 Low PT Treatments $Gait Training: 8-22 mins        [place] Pager [phone] Office [phone]   Echo [place] 03/05/2019, 6:17 PM

## 2019-03-05 NOTE — Op Note (Signed)
NAME: Brandon Farley, Brandon Farley MEDICAL RECORD [id number] ACCOUNT [id number] DATE OF BIRTH:25-Mar-1961 FACILITY: [facility] LOCATION: [facility] PHYSICIAN:Ollie Esty Kerry Fort, MD  OPERATIVE REPORT  DATE OF PROCEDURE:  03/05/2019  PREOPERATIVE DIAGNOSIS:  Primary osteoarthritis and degenerative joint disease, right hip.  POSTOPERATIVE DIAGNOSIS:  Primary osteoarthritis and degenerative joint disease, right hip.  PROCEDURE:  Right total hip arthroplasty through direct anterior approach.  IMPLANTS:  DePuy Sector Gription acetabular component size 60, size 36+0 neutral polyethylene liner, size 8 high-offset ACTIS femoral component, size 36+8.5 ceramic hip ball.  SURGEON:  Lind Guest. Ninfa Linden, MD  ASSISTANT:  Benita Stabile, PA-C  ANESTHESIA: 1.  Attempted spinal. 2.  General.  ANTIBIOTICS:  Two grams IV Ancef.  ESTIMATED BLOOD LOSS:  500 mL.  COMPLICATIONS:  None.  INDICATIONS:  The patient is a 59 year old gentleman well known to me.  He has debilitating arthritis involving his right hip.  This has been verified with x-rays and clinical exam.  At this point, he has tried and failed all forms of conservative  treatment.  His right hip pain is daily, and it is detrimentally affecting his mobility, his quality of life, and his activities of daily living to the point he does wish to proceed with a total hip arthroplasty on the right side.  We talked in detail  and length about the risk of acute blood loss anemia, nerve and vessel injury, fracture, infection, dislocation, DVT and implant failure.  We talked about our goals being decreased pain, improved mobility, and overall improved quality of life.  DESCRIPTION OF PROCEDURE:  After informed consent was obtained and appropriate right hip was marked, he was brought to the operating room and sat up on a stretcher where they attempted spinal anesthesia.  They were unsuccessful with that.  He was laid in  a supine position on a stretcher, and general  anesthesia was then obtained.  We decided not to place a Foley catheter since the spinal was unsuccessful.  I did assess his leg length, and he was just slightly shorter clinically on the right side  comparing the right and left.  We then placed traction boots on his feet and placed him supine on the Hana fracture table.  I then assessed him radiographically, and actually intraoperative radiographs look like he is longer on the right side, so we  actually had to make him even a little bit longer than that.  His right hip was then prepped and draped with DuraPrep and sterile drapes.  A time-out was called.  He was identified as correct patient, correct right hip.  We then made an incision just  inferior and posterior to the anterior superior iliac spine and carried this obliquely down the leg.  We dissected down tensor fascia lata muscle.  Tensor fascia was then divided longitudinally to proceed with a direct anterior approach to the hip.  We  identified and cauterized the circumflex vessels.  Then identified the hip capsule, opened up the hip capsule in an L-type format finding moderate joint effusion and significant periarticular osteophytes around the femoral head and neck.  We placed Cobra  retractors around the medial and lateral femoral neck, and then made our femoral neck cut with an oscillating saw and completed this with an osteotome.  This was proximal to the lesser trochanter.  We then placed a corkscrew guide in the femoral head  and removed the femoral head in its entirety.  It was a very large femoral head and essentially no cartilage at all.  We then placed a bent Hohmann over the medial acetabular rim, and I removed remnants of the acetabular labrum and other debris from the  hip.  We then began reaming under direct visualization from a size 46 reamer in stepwise increments going all the way to a size 59 with all reamers under direct visualization, the last reamer under direct fluoroscopy so we  could obtain our depth of  reaming, our inclination and anteversion.  I then placed the real DePuy Sector Gription acetabular component size 60 and a 36+0 neutral polyethylene liner for that size acetabular component.  Attention was then turned to the femur.  With the leg  externally rotated to 120 degrees, extended and adducted, we were able to place a Mueller retractor medially and a Hohmann retractor behind the greater trochanter.  We released the lateral joint capsule and used a box-cutting osteotome to enter the  femoral canal and a rongeur to lateralize it.  We then began broaching using the ACTIS broaching system from a size 0 all the way up to a size 8.  With the size 8 in place, we tried a high-offset femoral neck and a 36+1.5 hip ball, reduced this in the  acetabulum, and we felt like we needed definitely more leg length.  We then removed all trial components and placed the real high-offset femoral component, size 8 from ACTIS/DePuy, and we went with a 36+8.5 ceramic elbow hip ball.  Again reduced this in  the acetabulum, and I was pleased with the leg length, offset, range of motion, and stability assessed radiographically and clinically.  We then irrigated the soft tissue with normal saline solution using pulsatile lavage.  I was able to close the joint  capsule with interrupted #1 Ethibond suture.  A #1 Vicryl was used to close the tensor fascia, 0 Vicryl was used to close the deep tissue, 2-0 Vicryl to close the subcutaneous tissue, and a 4-0 Monocryl subcuticular stitch was placed.  Steri-Strips and  an Aquacel dressing were then applied.  He was taken off the fracture table, awakened, extubated, and taken to recovery room in stable condition.  All final counts were correct.  There were no complications noted.  Note Rexene EdisonGil Clark, PA-C, assisted during  the entire case.  His assistance was crucial for facilitating all aspects of this case.  LN/NUANCE  D:03/05/2019 T:03/05/2019  JOB:[id number]

## 2019-03-05 NOTE — Addendum Note (Signed)
Addendum  created 03/05/19 1035 by Garrel Ridgel, CRNA   Intraprocedure Meds edited

## 2019-03-05 NOTE — Transfer of Care (Signed)
Immediate Anesthesia Transfer of Care Note  Patient: Brandon Farley  Procedure(s) Performed: RIGHT TOTAL HIP ARTHROPLASTY ANTERIOR APPROACH (Right Hip)  Patient Location: PACU  Anesthesia Type:General  Level of Consciousness: awake, alert , oriented and patient cooperative  Airway & Oxygen Therapy: Patient Spontanous Breathing and Patient connected to face mask oxygen  Post-op Assessment: Report given to RN and Post -op Vital signs reviewed and stable  Post vital signs: Reviewed and stable  Last Vitals:  Vitals Value Taken Time  BP 148/98 03/05/19 0925  Temp    Pulse 94 03/05/19 0926  Resp 16 03/05/19 0926  SpO2 100 % 03/05/19 0926  Vitals shown include unvalidated device data.  Last Pain:  Vitals:   03/05/19 0546  TempSrc: Oral      Patients Stated Pain Goal: 4 (20/94/70 9628)  Complications: No apparent anesthesia complications

## 2019-03-06 LAB — CBC
HCT: 34.8 % — ABNORMAL LOW (ref 39.0–52.0)
Hemoglobin: 11.2 g/dL — ABNORMAL LOW (ref 13.0–17.0)
MCH: 29.2 pg (ref 26.0–34.0)
MCHC: 32.2 g/dL (ref 30.0–36.0)
MCV: 90.9 fL (ref 80.0–100.0)
Platelets: 182 10*3/uL (ref 150–400)
RBC: 3.83 MIL/uL — ABNORMAL LOW (ref 4.22–5.81)
RDW: 13.3 % (ref 11.5–15.5)
WBC: 14.2 10*3/uL — ABNORMAL HIGH (ref 4.0–10.5)
nRBC: 0 % (ref 0.0–0.2)

## 2019-03-06 LAB — BASIC METABOLIC PANEL
Anion gap: 7 (ref 5–15)
BUN: 22 mg/dL — ABNORMAL HIGH (ref 6–20)
CO2: 24 mmol/L (ref 22–32)
Calcium: 8.5 mg/dL — ABNORMAL LOW (ref 8.9–10.3)
Chloride: 103 mmol/L (ref 98–111)
Creatinine, Ser: 0.87 mg/dL (ref 0.61–1.24)
GFR calc Af Amer: >60 mL/min (ref >60)
GFR calc non Af Amer: >60 mL/min (ref >60)
Glucose, Bld: 124 mg/dL — ABNORMAL HIGH (ref 70–99)
Potassium: 3.7 mmol/L (ref 3.5–5.1)
Sodium: 134 mmol/L — ABNORMAL LOW (ref 135–145)

## 2019-03-06 MED ORDER — ASPIRIN 81 MG PO CHEW: 81.0000 mg | CHEWABLE_TABLET | Freq: Two times a day (BID) | ORAL | 0 refills | Status: AC

## 2019-03-06 MED ORDER — METHOCARBAMOL 500 MG PO TABS: 500.0000 mg | ORAL_TABLET | Freq: Four times a day (QID) | ORAL | 1 refills | Status: DC | PRN

## 2019-03-06 MED ORDER — OXYCODONE HCL 5 MG PO TABS: 5.0000 mg | ORAL_TABLET | Freq: Four times a day (QID) | ORAL | 0 refills | Status: DC | PRN

## 2019-03-06 NOTE — TOC Initial Note (Signed)
Transition of Care [facility]) - Initial/Assessment Note    Patient Details  Name: Brandon Farley MRN: [id number] Date of Birth: 01-02-1961  [facility] CM/SW Contact:    Joaquin Courts, RN Phone Number: 03/06/2019, 11:25 AM  Clinical Narrative:     CM spoke with patient at bedside. Patient set up with [facility] for [place]. Patient reports has rolling walker and 3-in-1.               Expected Discharge Plan: [place] Barriers to Discharge: No Barriers Identified   Patient Goals and CMS Choice Patient states their goals for this hospitalization and ongoing recovery are:: to go home CMS Medicare.gov Compare Post Acute Care list provided to:: Patient Choice offered to / list presented to : Patient  Expected Discharge Plan and Services Expected Discharge Plan: [place]   Discharge Planning Services: CM Consult Post Acute Care Choice: [place] arrangements for the past 2 months: Single Family Home Expected Discharge Date: 03/06/19               DME Arranged: N/A DME Agency: NA       HH Arranged: PT HH Agency: [facility] (formerly [employer]) Date [place]: 03/06/19 Time [place]: 45 Representative spoke with at [place]: pre arranged by MD office  Prior Living Arrangements/Services Living arrangements for the past 2 months: Single Family Home Lives with:: Self Patient language and need for interpreter reviewed:: Yes Do you feel safe going back to the place where you live?: Yes      Need for Family Participation in Patient Care: Yes (Comment) Care giver support system in place?: Yes (comment)   Criminal Activity/Legal Involvement Pertinent to Current Situation/Hospitalization: No - Comment as needed  Activities of Daily Living Home Assistive Devices/Equipment: Walker (specify type), Blood pressure cuff, Eyeglasses ADL Screening (condition at time of  admission) Patient's cognitive ability adequate to safely complete daily activities?: Yes Is the patient deaf or have difficulty hearing?: No Does the patient have difficulty seeing, even when wearing glasses/contacts?: No Does the patient have difficulty concentrating, remembering, or making decisions?: No Patient able to express need for assistance with ADLs?: Yes Does the patient have difficulty dressing or bathing?: No Independently performs ADLs?: Yes (appropriate for developmental age) Does the patient have difficulty walking or climbing stairs?: No Weakness of Legs: None Weakness of Arms/Hands: None  Permission Sought/Granted                  Emotional Assessment Appearance:: Appears stated age Attitude/Demeanor/Rapport: Engaged Affect (typically observed): Accepting Orientation: : Oriented to Place, Oriented to  Time, Oriented to Situation, Oriented to Self   Psych Involvement: No (comment)  Admission diagnosis:  right hip osteoarthritis Patient Active Problem List   Diagnosis Date Noted  . Status post total replacement of right hip 03/05/2019  . Unilateral primary osteoarthritis, right hip 01/27/2019   PCP:  Mike Gip, MD Pharmacy:   [id number] [place], [place] [address] [place] [address] Phone: [phone] Fax: [phone]     Social Determinants of Health (SDOH) Interventions    Readmission Risk Interventions No flowsheet data found.

## 2019-03-06 NOTE — Progress Notes (Signed)
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  Pt reviewed shower transfers, car transfers, stairs, lower body dressing and therex program.  [facility] to follow at home.   Follow Up Recommendations  Home health PT;Follow surgeon's recommendation for DC plan and follow-up therapies     Equipment Recommendations  None recommended by PT    Recommendations for Other Services       Precautions / Restrictions Precautions Precautions: Fall Restrictions Weight Bearing Restrictions: No Other Position/Activity Restrictions: WBAT    Mobility  Bed Mobility Overal bed mobility: Needs Assistance Bed Mobility: Supine to Sit     Supine to sit: Min guard     General bed mobility comments: cues for sequence and use of L LE to self assist  Transfers Overall transfer level: Needs assistance Equipment used: Rolling walker (2 wheeled) Transfers: Sit to/from Stand Sit to Stand: Min guard;Supervision         General transfer comment: cues for LE management and use of UEs to self assist  Ambulation/Gait Ambulation/Gait assistance: Min guard;Supervision Gait Distance (Feet): 120 Feet Assistive device: Rolling walker (2 wheeled) Gait Pattern/deviations: Step-to pattern;Decreased step length - right;Decreased step length - left;Shuffle;Trunk flexed Gait velocity: decr   General Gait Details: cues for sequence, posture and position from RW   Stairs Stairs: Yes Stairs assistance: Min assist;Min guard Stair Management: No rails;Step to pattern;Forwards;With walker Number of Stairs: 2 General stair comments: single step twice with RW; cues for sequence and foot/RW placement   Wheelchair Mobility    Modified  Rankin (Stroke Patients Only)       Balance Overall balance assessment: Mild deficits observed, not formally tested                                          Cognition Arousal/Alertness: Awake/alert Behavior During Therapy: WFL for tasks assessed/performed Overall Cognitive Status: Within Functional Limits for tasks assessed                                        Exercises Total Joint Exercises Ankle Circles/Pumps: AROM;Both;20 reps;Supine Quad Sets: AROM;Both;10 reps;Supine Heel Slides: AAROM;Right;Supine;20 reps Hip ABduction/ADduction: AAROM;Right;15 reps;Supine    General Comments        Pertinent Vitals/Pain Pain Assessment: 0-10 Pain Score: 5  Pain Location: R hip/thigh Pain Descriptors / Indicators: Aching;Burning Pain Intervention(s): Limited activity within patient's tolerance;Monitored during session;Premedicated before session;Ice applied    Home Living                      Prior Function            PT Goals (current goals can now be found in the care plan section) Acute Rehab PT Goals Patient Stated Goal: Regain IND PT Goal Formulation: With patient Time For Goal Achievement: 03/12/19 Potential to Achieve Goals: Good Progress towards PT goals: Progressing toward goals    Frequency    7X/week      PT Plan Current plan remains appropriate  Co-evaluation              AM-PAC PT "6 Clicks" Mobility   Outcome Measure  Help needed turning from your back to your side while in a flat bed without using bedrails?: A Little Help needed moving from lying on your back to sitting on the side of a flat bed without using bedrails?: A Little Help needed moving to and from a bed to a chair (including a wheelchair)?: A Little Help needed standing up from a chair using your arms (e.g., wheelchair or bedside chair)?: A Little Help needed to walk in hospital room?: A Little Help needed climbing 3-5 steps with a  railing? : A Little 6 Click Score: 18    End of Session Equipment Utilized During Treatment: Gait belt Activity Tolerance: Patient tolerated treatment well Patient left: in chair;with call bell/phone within reach;with chair alarm set;with family/visitor present Nurse Communication: Mobility status PT Visit Diagnosis: Difficulty in walking, not elsewhere classified (R26.2)     Time: 8341-9622 PT Time Calculation (min) (ACUTE ONLY): 41 min  Charges:  $Gait Training: 8-22 mins $Therapeutic Exercise: 8-22 mins $Therapeutic Activity: 8-22 mins                     [place] Pager [phone] Office [phone]    Brandon Farley 03/06/2019, 11:46 AM

## 2019-03-06 NOTE — Discharge Summary (Signed)
Patient ID: Brandon Farley MRN: [id number] DOB/AGE: June 05, 1961 58 y.o.  Admit date: 03/05/2019 Discharge date: 03/06/2019  Admission Diagnoses:  Principal Problem:   Unilateral primary osteoarthritis, right hip Active Problems:   Status post total replacement of right hip   Discharge Diagnoses:  Same  Past Medical History:  Diagnosis Date  . Arthritis   . Dysrhythmia 01/2019   had "Fluttering in chest" while harking hard in the heat  . Family history of adverse reaction to anesthesia    N&V  . History of kidney stones    2009  . 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 Pneumonia    20 years ago    Surgeries: Procedure(s): RIGHT TOTAL HIP ARTHROPLASTY ANTERIOR APPROACH on 03/05/2019   Consultants:   Discharged Condition: Improved  Hospital Course: CORRADO HYMON is an 58 y.o. male who was admitted 03/05/2019 for operative treatment ofUnilateral primary osteoarthritis, right hip. Patient has severe unremitting pain that affects sleep, daily activities, and work/hobbies. After pre-op clearance the patient was taken to the operating room on 03/05/2019 and underwent  Procedure(s): RIGHT TOTAL HIP ARTHROPLASTY ANTERIOR APPROACH.    Patient was given perioperative antibiotics:  Anti-infectives (From admission, onward)   Start     Dose/Rate Route Frequency Ordered Stop   03/05/19 1400  ceFAZolin (ANCEF) IVPB 1 g/50 mL premix     1 g 100 mL/hr over 30 Minutes Intravenous Every 6 hours 03/05/19 1012 03/05/19 2038   03/05/19 0600  ceFAZolin (ANCEF) IVPB 2g/100 mL premix     2 g 200 mL/hr over 30 Minutes Intravenous On call to O.R. 03/05/19 0533 03/05/19 0743   03/05/19 0539  ceFAZolin (ANCEF) 2-4 GM/100ML-% IVPB    Note to Pharmacy: Randa Evens  : cabinet override      03/05/19 0539 03/05/19 0713       Patient was given sequential compression devices, early ambulation, and chemoprophylaxis to prevent DVT.  Patient benefited maximally from hospital stay and there were  no complications.    Recent vital signs:  Patient Vitals for the past 24 hrs:  BP Temp Temp src Pulse Resp SpO2  03/06/19 0518 103/68 98.9 F (37.2 C) Oral 77 20 98 %  03/06/19 0141 123/77 98.6 F (37 C) - 82 20 96 %  03/05/19 2112 135/80 98.2 F (36.8 C) Oral (!) 103 20 97 %  03/05/19 1739 (!) 146/87 (!) 97.3 F (36.3 C) - 86 16 98 %  03/05/19 1418 (!) 141/77 (!) 97.5 F (36.4 C) - 73 16 96 %  03/05/19 1240 127/70 98.2 F (36.8 C) Oral 76 16 98 %  03/05/19 1122 129/70 - - 71 17 99 %  03/05/19 1028 (!) 141/79 - - 70 16 91 %  03/05/19 1015 (!) 143/81 - - 69 - 100 %  03/05/19 0952 - - - 74 (!) 26 100 %  03/05/19 0945 (!) 146/77 - - 74 11 100 %     Recent laboratory studies:  Recent Labs    03/06/19 0245  WBC 14.2*  HGB 11.2*  HCT 34.8*  PLT 182  NA 134*  K 3.7  CL 103  CO2 24  BUN 22*  CREATININE 0.87  GLUCOSE 124*  CALCIUM 8.5*     Discharge Medications:   Allergies as of 03/06/2019   No Known Allergies     Medication List    STOP taking these medications   aspirin EC 81 MG tablet Replaced by: aspirin 81 MG chewable tablet  TAKE these medications   aspirin 81 MG chewable tablet Chew 1 tablet (81 mg total) by mouth 2 (two) times daily. Replaces: aspirin EC 81 MG tablet   Emergen-C Immune Pack Take 2 tablets by mouth daily.   ibuprofen 200 MG tablet Commonly known as: ADVIL Take 200 mg by mouth every 6 (six) hours as needed for moderate pain.   methocarbamol 500 MG tablet Commonly known as: ROBAXIN Take 1 tablet (500 mg total) by mouth every 6 (six) hours as needed for muscle spasms.   oxyCODONE 5 MG immediate release tablet Commonly known as: Oxy IR/ROXICODONE Take 1-2 tablets (5-10 mg total) by mouth every 6 (six) hours as needed for moderate pain (pain score 4-6).            Durable Medical Equipment  (From admission, onward)         Start     Ordered   03/05/19 1025  DME Walker rolling  Once    Question:  Patient needs a walker  to treat with the following condition  Answer:  Status post total replacement of right hip   03/05/19 1024   03/05/19 1025  DME 3 n 1  Once     03/05/19 1024          Diagnostic Studies: Dg Pelvis Portable  Result Date: 03/05/2019 CLINICAL DATA:  Right hip arthroplasty EXAM: PORTABLE PELVIS 1-2 VIEWS COMPARISON:  None. FINDINGS: AP view of the pelvis demonstrates right total hip arthroplasty. Hardware is in its expected alignment without periprosthetic fracture. Advanced spondylosis at the lumbosacral junction. Mild bilateral SI joint OA. Expected postoperative changes within the soft tissues about the right hip. IMPRESSION: Interval postsurgical changes from right total hip arthroplasty without evidence of immediate postoperative complication. Electronically Signed   By: Duanne GuessNicholas  Plundo M.D.   On: 03/05/2019 10:12   Dg C-arm 1-60 Min-no Report  Result Date: 03/05/2019 Fluoroscopy was utilized by the requesting physician.  No radiographic interpretation.   Dg Hip Operative Unilat W Or W/o Pelvis Right  Result Date: 03/05/2019 CLINICAL DATA:  Right hip replacement EXAM: OPERATIVE RIGHT HIP (WITH PELVIS IF PERFORMED) 2 VIEWS TECHNIQUE: Fluoroscopic spot image(s) were submitted for interpretation post-operatively. FLUOROSCOPY TIME:  28 seconds 4.95 mGy COMPARISON:  None. FINDINGS: Intraoperative fluoroscopic spot image. Right total hip arthroplasty. IMPRESSION: Interval right total hip arthroplasty. Electronically Signed   By: Elige KoHetal  Patel   On: 03/05/2019 09:08    Disposition: Discharge disposition: 01-Home or Self Care         Follow-up Information    Kathryne HitchBlackman, Abrea Henle Y, MD. Schedule an appointment as soon as possible for a visit in 2 week(s).   Specialty: Orthopedic Surgery Contact information: [address] [place] [address] [phone]            Signed: Kathryne HitchChristopher Y Beacher Every 03/06/2019, 9:40 AM

## 2019-03-06 NOTE — Progress Notes (Signed)
Subjective: 1 Day Post-Op Procedure(s) (LRB): RIGHT TOTAL HIP ARTHROPLASTY ANTERIOR APPROACH (Right) Patient reports pain as moderate.  Already has good mobility.  Objective: Vital signs in last 24 hours: Temp:  [97.3 F (36.3 C)-98.9 F (37.2 C)] 98.9 F (37.2 C) (08/29 0518) Pulse Rate:  [69-103] 77 (08/29 0518) Resp:  [11-26] 20 (08/29 0518) BP: (103-159)/(68-101) 103/68 (08/29 0518) SpO2:  [91 %-100 %] 98 % (08/29 0518)  Intake/Output from previous day: 08/28 0701 - 08/29 0700 In: 3342.5 [P.O.:480; I.V.:2512.5; IV Piggyback:350] Out: 1425 [Urine:925; Blood:500] Intake/Output this shift: No intake/output data recorded.  Recent Labs    03/06/19 0245  HGB 11.2*   Recent Labs    03/06/19 0245  WBC 14.2*  RBC 3.83*  HCT 34.8*  PLT 182   Recent Labs    03/06/19 0245  NA 134*  K 3.7  CL 103  CO2 24  BUN 22*  CREATININE 0.87  GLUCOSE 124*  CALCIUM 8.5*   No results for input(s): LABPT, INR in the last 72 hours.  Sensation intact distally Intact pulses distally Dorsiflexion/Plantar flexion intact Incision: dressing C/D/I   Assessment/Plan: 1 Day Post-Op Procedure(s) (LRB): RIGHT TOTAL HIP ARTHROPLASTY ANTERIOR APPROACH (Right) Up with therapy Discharge home with home health this afternoon.   Patient's anticipated LOS is less than 2 midnights, meeting these requirements: - Younger than 87 - Lives within 1 hour of care - Has a competent adult at home to recover with post-op recover - NO history of  - Chronic pain requiring opiods  - Diabetes  - Coronary Artery Disease  - Heart failure  - Heart attack  - Stroke  - DVT/VTE  - Cardiac arrhythmia  - Respiratory Failure/COPD  - Renal failure  - Anemia  - Advanced Liver disease       Mcarthur Rossetti 03/06/2019, 9:15 AM

## 2019-03-06 NOTE — Progress Notes (Signed)
    Home health agencies that serve [phone].        [place] Quality of Patient Care Rating Patient Survey Summary Rating  [facility] [phone] 4 out of 5 stars 4 out of [place] [phone] 4 out of 5 stars 4 out of [place] [phone] 3  out of 5 stars 4 out of [place] [facility] [phone]) [phone] 2  out of 5 stars 4 out of [place] [phone] 4  out of 5 stars 4 out of [place] [phone] 3 out of 5 stars 4 out of [place] [phone] 4 out of 5 stars 5 out of [place] [phone] 3  out of 5 stars 4 out of [place] [phone]) [phone] 3 out of 5 stars 3 out of [place] [phone] 2 out of 5 stars 5 out of [place] number Footnote as displayed on [place]  1 This agency provides services under a federal waiver program to non-traditional, chronic long term population.  2 This agency provides services to a special needs population.  3 Not Available.  4 The number of patient episodes for this measure is too small to report.  5 This measure currently does not have data or provider has been certified/recertified for less than 6 months.  6 The national average for this measure is not provided because of state-to-state differences in data collection.  7 Medicare is not displaying rates for this measure for any home health agency, because of an issue with the data.  8 There were problems with the data and they are being corrected.  9 Zero, or very few, patients met the survey's rules for inclusion. The scores shown, if any, reflect a very small number of surveys and may not accurately tell how  an agency is doing.  10 Survey results are based on less than 12 months of data.  11 Fewer than 70 patients completed the survey. Use the scores shown, if any, with caution as the number of surveys may be too low to accurately tell how an agency is doing.  12 No survey results are available for this period.  13 Data suppressed by CMS for one or more quarters.

## 2019-03-06 NOTE — Discharge Instructions (Signed)
INSTRUCTIONS AFTER JOINT REPLACEMENT  ° °o Remove items at home which could result in a fall. This includes throw rugs or furniture in walking pathways °o ICE to the affected joint every three hours while awake for 30 minutes at a time, for at least the first 3-5 days, and then as needed for pain and swelling.  Continue to use ice for pain and swelling. You may notice swelling that will progress down to the foot and ankle.  This is normal after surgery.  Elevate your leg when you are not up walking on it.   °o Continue to use the breathing machine you got in the hospital (incentive spirometer) which will help keep your temperature down.  It is common for your temperature to cycle up and down following surgery, especially at night when you are not up moving around and exerting yourself.  The breathing machine keeps your lungs expanded and your temperature down. ° ° °DIET:  As you were doing prior to hospitalization, we recommend a well-balanced diet. ° °DRESSING / WOUND CARE / SHOWERING ° °Keep the surgical dressing until follow up.  The dressing is water proof, so you can shower without any extra covering.  IF THE DRESSING FALLS OFF or the wound gets wet inside, change the dressing with sterile gauze.  Please use good hand washing techniques before changing the dressing.  Do not use any lotions or creams on the incision until instructed by your surgeon.   ° °ACTIVITY ° °o Increase activity slowly as tolerated, but follow the weight bearing instructions below.   °o No driving for 6 weeks or until further direction given by your physician.  You cannot drive while taking narcotics.  °o No lifting or carrying greater than 10 lbs. until further directed by your surgeon. °o Avoid periods of inactivity such as sitting longer than an hour when not asleep. This helps prevent blood clots.  °o You may return to work once you are authorized by your doctor.  ° ° ° °WEIGHT BEARING  ° °Weight bearing as tolerated with assist  device (walker, cane, etc) as directed, use it as long as suggested by your surgeon or therapist, typically at least 4-6 weeks. ° ° °EXERCISES ° °Results after joint replacement surgery are often greatly improved when you follow the exercise, range of motion and muscle strengthening exercises prescribed by your doctor. Safety measures are also important to protect the joint from further injury. Any time any of these exercises cause you to have increased pain or swelling, decrease what you are doing until you are comfortable again and then slowly increase them. If you have problems or questions, call your caregiver or physical therapist for advice.  ° °Rehabilitation is important following a joint replacement. After just a few days of immobilization, the muscles of the leg can become weakened and shrink (atrophy).  These exercises are designed to build up the tone and strength of the thigh and leg muscles and to improve motion. Often times heat used for twenty to thirty minutes before working out will loosen up your tissues and help with improving the range of motion but do not use heat for the first two weeks following surgery (sometimes heat can increase post-operative swelling).  ° °These exercises can be done on a training (exercise) mat, on the floor, on a table or on a bed. Use whatever works the best and is most comfortable for you.    Use music or television while you are exercising so that   the exercises are a pleasant break in your day. This will make your life better with the exercises acting as a break in your routine that you can look forward to.   Perform all exercises about fifteen times, three times per day or as directed.  You should exercise both the operative leg and the other leg as well. ° °Exercises include: °  °• Quad Sets - Tighten up the muscle on the front of the thigh (Quad) and hold for 5-10 seconds.   °• Straight Leg Raises - With your knee straight (if you were given a brace, keep it on),  lift the leg to 60 degrees, hold for 3 seconds, and slowly lower the leg.  Perform this exercise against resistance later as your leg gets stronger.  °• Leg Slides: Lying on your back, slowly slide your foot toward your buttocks, bending your knee up off the floor (only go as far as is comfortable). Then slowly slide your foot back down until your leg is flat on the floor again.  °• Angel Wings: Lying on your back spread your legs to the side as far apart as you can without causing discomfort.  °• Hamstring Strength:  Lying on your back, push your heel against the floor with your leg straight by tightening up the muscles of your buttocks.  Repeat, but this time bend your knee to a comfortable angle, and push your heel against the floor.  You may put a pillow under the heel to make it more comfortable if necessary.  ° °A rehabilitation program following joint replacement surgery can speed recovery and prevent re-injury in the future due to weakened muscles. Contact your doctor or a physical therapist for more information on knee rehabilitation.  ° ° °CONSTIPATION ° °Constipation is defined medically as fewer than three stools per week and severe constipation as less than one stool per week.  Even if you have a regular bowel pattern at home, your normal regimen is likely to be disrupted due to multiple reasons following surgery.  Combination of anesthesia, postoperative narcotics, change in appetite and fluid intake all can affect your bowels.  ° °YOU MUST use at least one of the following options; they are listed in order of increasing strength to get the job done.  They are all available over the counter, and you may need to use some, POSSIBLY even all of these options:   ° °Drink plenty of fluids (prune juice may be helpful) and high fiber foods °Colace 100 mg by mouth twice a day  °Senokot for constipation as directed and as needed Dulcolax (bisacodyl), take with full glass of water  °Miralax (polyethylene glycol)  once or twice a day as needed. ° °If you have tried all these things and are unable to have a bowel movement in the first 3-4 days after surgery call either your surgeon or your primary doctor.   ° °If you experience loose stools or diarrhea, hold the medications until you stool forms back up.  If your symptoms do not get better within 1 week or if they get worse, check with your doctor.  If you experience "the worst abdominal pain ever" or develop nausea or vomiting, please contact the office immediately for further recommendations for treatment. ° ° °ITCHING:  If you experience itching with your medications, try taking only a single pain pill, or even half a pain pill at a time.  You can also use Benadryl over the counter for itching or also to   help with sleep.   TED HOSE STOCKINGS:  Use stockings on both legs until for at least 2 weeks or as directed by physician office. They may be removed at night for sleeping.  MEDICATIONS:  See your medication summary on the After Visit Summary that nursing will review with you.  You may have some home medications which will be placed on hold until you complete the course of blood thinner medication.  It is important for you to complete the blood thinner medication as prescribed.  PRECAUTIONS:  If you experience chest pain or shortness of breath - call 911 immediately for transfer to the hospital emergency department.   If you develop a fever greater that 101 F, purulent drainage from wound, increased redness or drainage from wound, foul odor from the wound/dressing, or calf pain - CONTACT YOUR SURGEON.                                                   FOLLOW-UP APPOINTMENTS:  If you do not already have a post-op appointment, please call the office for an appointment to be seen by your surgeon.  Guidelines for how soon to be seen are listed in your After Visit Summary, but are typically between 1-4 weeks after surgery.  OTHER INSTRUCTIONS:   Knee  Replacement:  Do not place pillow under knee, focus on keeping the knee straight while resting. CPM instructions: 0-90 degrees, 2 hours in the morning, 2 hours in the afternoon, and 2 hours in the evening. Place foam block, curve side up under heel at all times except when in CPM or when walking.  DO NOT modify, tear, cut, or change the foam block in any way.  MAKE SURE YOU:   Understand these instructions.   Get help right away if you are not doing well or get worse.    Thank you for letting us be a part of your medical care team.  It is a privilege we respect greatly.  We hope these instructions will help you stay on track for a fast and full recovery!    If your dressing gets significantly dirty, you can remove it in one week and place a new dressing on it that has been provided.

## 2019-03-08 ENCOUNTER — Encounter (HOSPITAL_COMMUNITY): Payer: Self-pay | Admitting: Orthopaedic Surgery

## 2019-03-10 ENCOUNTER — Telehealth: Payer: Self-pay | Admitting: Orthopaedic Surgery

## 2019-03-10 NOTE — Telephone Encounter (Signed)
Faxed noted to provided number

## 2019-03-10 NOTE — Telephone Encounter (Signed)
Faxed to [employer]

## 2019-03-10 NOTE — Telephone Encounter (Signed)
Patient's son called stating that he spoke to [facility] in [place] and was told that if we fax the referral to them, they would be able to see him.  I advised the son that you had found a PT company that would be able to help him as well.  He requested that we go ahead and fax it to [facility] and they would talk to them both.  [facility]'s fax number is [phone].  Thank you.

## 2019-03-10 NOTE — Telephone Encounter (Signed)
Kennyth Lose from [facility] called requesting the patient's medication list along with the past and/or current medical history.  She stated that if she gets this today, she can probably see him tomorrow.  Fax [phone].  CB#[phone].  Thank you.

## 2019-03-18 ENCOUNTER — Ambulatory Visit (INDEPENDENT_AMBULATORY_CARE_PROVIDER_SITE_OTHER): Payer: BC Managed Care – PPO | Admitting: Physician Assistant

## 2019-03-18 ENCOUNTER — Encounter: Payer: Self-pay | Admitting: Physician Assistant

## 2019-03-18 DIAGNOSIS — Z96641 Presence of right artificial hip joint: Secondary | ICD-10-CM

## 2019-03-18 NOTE — Progress Notes (Signed)
HPI: [name] returns today 13 days status post right total hip arthroplasty.  He is overall doing well.  He states that his pain is minimal he only takes ibuprofen twice a day.  Is been taking aspirin twice daily for DVT prophylaxis.  He was on an 81 mg aspirin prior to surgery.  Is chronic left foot drop.  He states that that is the only real reason he is using a cane today.  Review of systems: Denies fevers chills shortness of breath chest pain  Physical exam: Right hip surgical incisions healed well no signs of dehiscence.  Wound is closed with a subcu running stitch.  Very minimal seroma present.  No abnormal warmth or erythema.  Right calf supple nontender.  He is able dorsiflex plantarflex right ankle.  Impression: Status post right total hip arthroplasty 03/05/2019  Plan: I have him work on scar tissue mobilization and desensitization of the wound.  He will go back on his 81 mg aspirin daily.  He will follow-up with [place] in a month sooner if there is any questions concerns.  Questions were encouraged and answered at length.

## 2019-04-15 ENCOUNTER — Ambulatory Visit (INDEPENDENT_AMBULATORY_CARE_PROVIDER_SITE_OTHER): Payer: BC Managed Care – PPO

## 2019-04-15 ENCOUNTER — Ambulatory Visit (INDEPENDENT_AMBULATORY_CARE_PROVIDER_SITE_OTHER): Payer: BC Managed Care – PPO | Admitting: Physician Assistant

## 2019-04-15 ENCOUNTER — Encounter: Payer: Self-pay | Admitting: Physician Assistant

## 2019-04-15 ENCOUNTER — Other Ambulatory Visit: Payer: Self-pay

## 2019-04-15 DIAGNOSIS — Z96641 Presence of right artificial hip joint: Secondary | ICD-10-CM | POA: Diagnosis not present

## 2019-04-15 NOTE — Progress Notes (Addendum)
Office Visit Note   Patient: Brandon Farley           Date of Birth: 1961-03-03           MRN: [id number] Visit Date: 04/15/2019              Requested by: Eulis Foster, MD [address] [place],  [place] [address] PCP: Eulis Foster, MD   Assessment & Plan: Visit Diagnoses:  1. Status post total replacement of right hip     Plan: We will have to go slow and no extreme external/internal rotation of the right hip.  He will follow-up with [place] in a month ,sooner if there is any questions concerns.  Follow-Up Instructions: Return in about 4 weeks (around 05/13/2019).   Orders:  Orders Placed This Encounter  Procedures  . XR HIP UNILAT W OR W/O PELVIS 2-3 VIEWS RIGHT   No orders of the defined types were placed in this encounter.     Procedures: No procedures performed   Clinical Data: No additional findings.   Subjective: Chief Complaint  Patient presents with  . Right Hip - Follow-up    HPI Brandon Farley comes in today 41 days status post right total hip arthroplasty is overall doing well.  However he reports he has been sitting  with his leg extremely externally rotated and he felt something catch in his hip and a popping-like sensation.  He is sore today in his buttocks region.  Other than that he is doing well.  Review of Systems No fevers chills shortness of breath or chest pain.  Objective: Vital Signs: There were no vitals taken for this visit.  Physical Exam General: Well-developed well-nourished male in no acute distress. Ortho Exam Right hip full range of motion without pain.  Internal and external rotation causes no discomfort there is no sense of laxity or levering.  Right calf supple nontender. Specialty Comments:  No specialty comments available.  Imaging: Xr Hip Unilat W Or W/o Pelvis 2-3 Views Right  Result Date: 04/15/2019 AP pelvis lateral view of the right hip: No acute fracture.  Implants appear well seated.  Negative  for any hardware failure.    PMFS History: Patient Active Problem List   Diagnosis Date Noted  . Status post total replacement of right hip 03/05/2019  . Unilateral primary osteoarthritis, right hip 01/27/2019   Past Medical History:  Diagnosis Date  . Arthritis   . Dysrhythmia 01/2019   had "Fluttering in chest" while harking hard in the heat  . Family history of adverse reaction to anesthesia    N&V  . History of kidney stones    2009  . 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 Pneumonia    20 years ago    History reviewed. No pertinent family history.  Past Surgical History:  Procedure Laterality Date  . BACK SURGERY  1983   upper back  . FRACTURE SURGERY     rt foot as a child  . TOTAL HIP ARTHROPLASTY Right 03/05/2019   Procedure: RIGHT TOTAL HIP ARTHROPLASTY ANTERIOR APPROACH;  Surgeon: Kathryne Hitch, MD;  Location: [facility];  Service: Orthopedics;  Laterality: Right;   Social History   Occupational History  . Not on file  Tobacco Use  . Smoking status: Former Smoker    Packs/day: 0.50    Years: 15.00    Pack years: 7.50    Types: Cigarettes    Quit date: 03/01/1999  Years since quitting: 20.1  . Smokeless tobacco: Former Systems developer    Types: [place] date: 09/29/2018  Substance and Sexual Activity  . Alcohol use: Yes    Comment: occationally  . Drug use: Never  . Sexual activity: Not on file

## 2019-05-13 ENCOUNTER — Ambulatory Visit (INDEPENDENT_AMBULATORY_CARE_PROVIDER_SITE_OTHER): Payer: BC Managed Care – PPO | Admitting: Physician Assistant

## 2019-05-13 ENCOUNTER — Encounter: Payer: Self-pay | Admitting: Physician Assistant

## 2019-05-13 DIAGNOSIS — Z96641 Presence of right artificial hip joint: Secondary | ICD-10-CM

## 2019-05-13 NOTE — Progress Notes (Signed)
HPI: Mr. Koral is now five-point 2-1/2 months status post right total hip arthroplasty.  States overall the hip is doing very well he has no questions or concerns.  He has had no sensation of the hip popping up coming out since last visit.  Physical exam: Right hip good external/internal rotation without pain.  He ambulates without any assistive device.  Right calf supple nontender.  Dorsiflexion plantarflexion right ankle intact.  Impression: Status post right total hip arthroplasty 03/05/2019  Plan: We will continue work on range of motion strengthening.  Follow-up with [place] in 6 months at that time we will obtain AP pelvis and lateral view of the right hip.  Follow-up sooner if there is any questions or concerns.

## 2019-11-10 ENCOUNTER — Ambulatory Visit (INDEPENDENT_AMBULATORY_CARE_PROVIDER_SITE_OTHER): Payer: BC Managed Care – PPO | Admitting: Physician Assistant

## 2019-11-10 ENCOUNTER — Encounter: Payer: Self-pay | Admitting: Physician Assistant

## 2019-11-10 ENCOUNTER — Ambulatory Visit: Payer: Self-pay

## 2019-11-10 ENCOUNTER — Other Ambulatory Visit: Payer: Self-pay

## 2019-11-10 DIAGNOSIS — Z96641 Presence of right artificial hip joint: Secondary | ICD-10-CM | POA: Diagnosis not present

## 2019-11-10 NOTE — Progress Notes (Signed)
Office Visit Note   Patient: Brandon Farley           Date of Birth: 1960-11-12           MRN: [id number] Visit Date: 11/10/2019              Requested by: Mike Gip, MD [address] [place],  [place] [address] PCP: Mike Gip, MD   Assessment & Plan: Visit Diagnoses:  1. Status post total replacement of right hip     Plan: At this point time he will follow up with [place] on an as-needed basis.  Questions are encouraged and answered at length.  Did give him a handout on antibiotics and dental work.  Follow-Up Instructions: Return if symptoms worsen or fail to improve.   Orders:  Orders Placed This Encounter  Procedures  . XR HIP UNILAT W OR W/O PELVIS 2-3 VIEWS RIGHT   No orders of the defined types were placed in this encounter.     Procedures: No procedures performed   Clinical Data: No additional findings.   Subjective: Chief Complaint  Patient presents with  . Right Hip - Follow-up    HPI Brandon Farley returns today 8 months and 1 week status post right total hip arthroplasty.  He states the hip is doing well.  The has minimal discomfort in the hip at times but otherwise is doing very well.  He is back to regular activities. Review of Systems No fevers chills shortness of breath.  Objective: Vital Signs: There were no vitals taken for this visit.  Physical Exam General: Well-developed well-nourished male in no acute distress mood and affect appropriate. Psych: Alert and oriented x3 Ortho Exam Right hip excellent range of motion without pain right calf supple nontender.  Ambulates without any assistive device. Specialty Comments:  No specialty comments available.  Imaging:     XR HIP UNILAT W OR W/O PELVIS 2-3 VIEWS RIGHT  Result Date: 11/10/2019 AP pelvis lateral view right hip: Right hip is well located.  Well-seated arthroplasty components without any evidence of hardware failure.  No acute fractures.    PMFS History:  Patient Active Problem List   Diagnosis Date Noted  . Status post total replacement of right hip 03/05/2019  . Unilateral primary osteoarthritis, right hip 01/27/2019   Past Medical History:  Diagnosis Date  . Arthritis   . Dysrhythmia 01/2019   had "Fluttering in chest" while harking hard in the heat  . Family history of adverse reaction to anesthesia    N&V  . History of kidney stones    2009  . 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 Pneumonia    20 years ago    History reviewed. No pertinent family history.  Past Surgical History:  Procedure Laterality Date  . BACK SURGERY  1983   upper back  . FRACTURE SURGERY     rt foot as a child  . TOTAL HIP ARTHROPLASTY Right 03/05/2019   Procedure: RIGHT TOTAL HIP ARTHROPLASTY ANTERIOR APPROACH;  Surgeon: Mcarthur Rossetti, MD;  Location: [facility];  Service: Orthopedics;  Laterality: Right;   Social History   Occupational History  . Not on file  Tobacco Use  . Smoking status: Former Smoker    Packs/day: 0.50    Years: 15.00    Pack years: 7.50    Types: Cigarettes    Quit date: 03/01/1999    Years since quitting: 20.7  . Smokeless tobacco: Former  User    Types: Dorna Bloom    Quit date: 09/29/2018  Substance and Sexual Activity  . Alcohol use: Yes    Comment: occationally  . Drug use: Never  . Sexual activity: Not on file

## 2020-07-23 IMAGING — DX PORTABLE PELVIS 1-2 VIEWS
2 series · 2 of 2 positions shown · non-contrast
Comparison: None.

CLINICAL DATA: Right hip arthroplasty

EXAM:
PORTABLE PELVIS 1-2 VIEWS

[pelvis ap (1 of 2)]
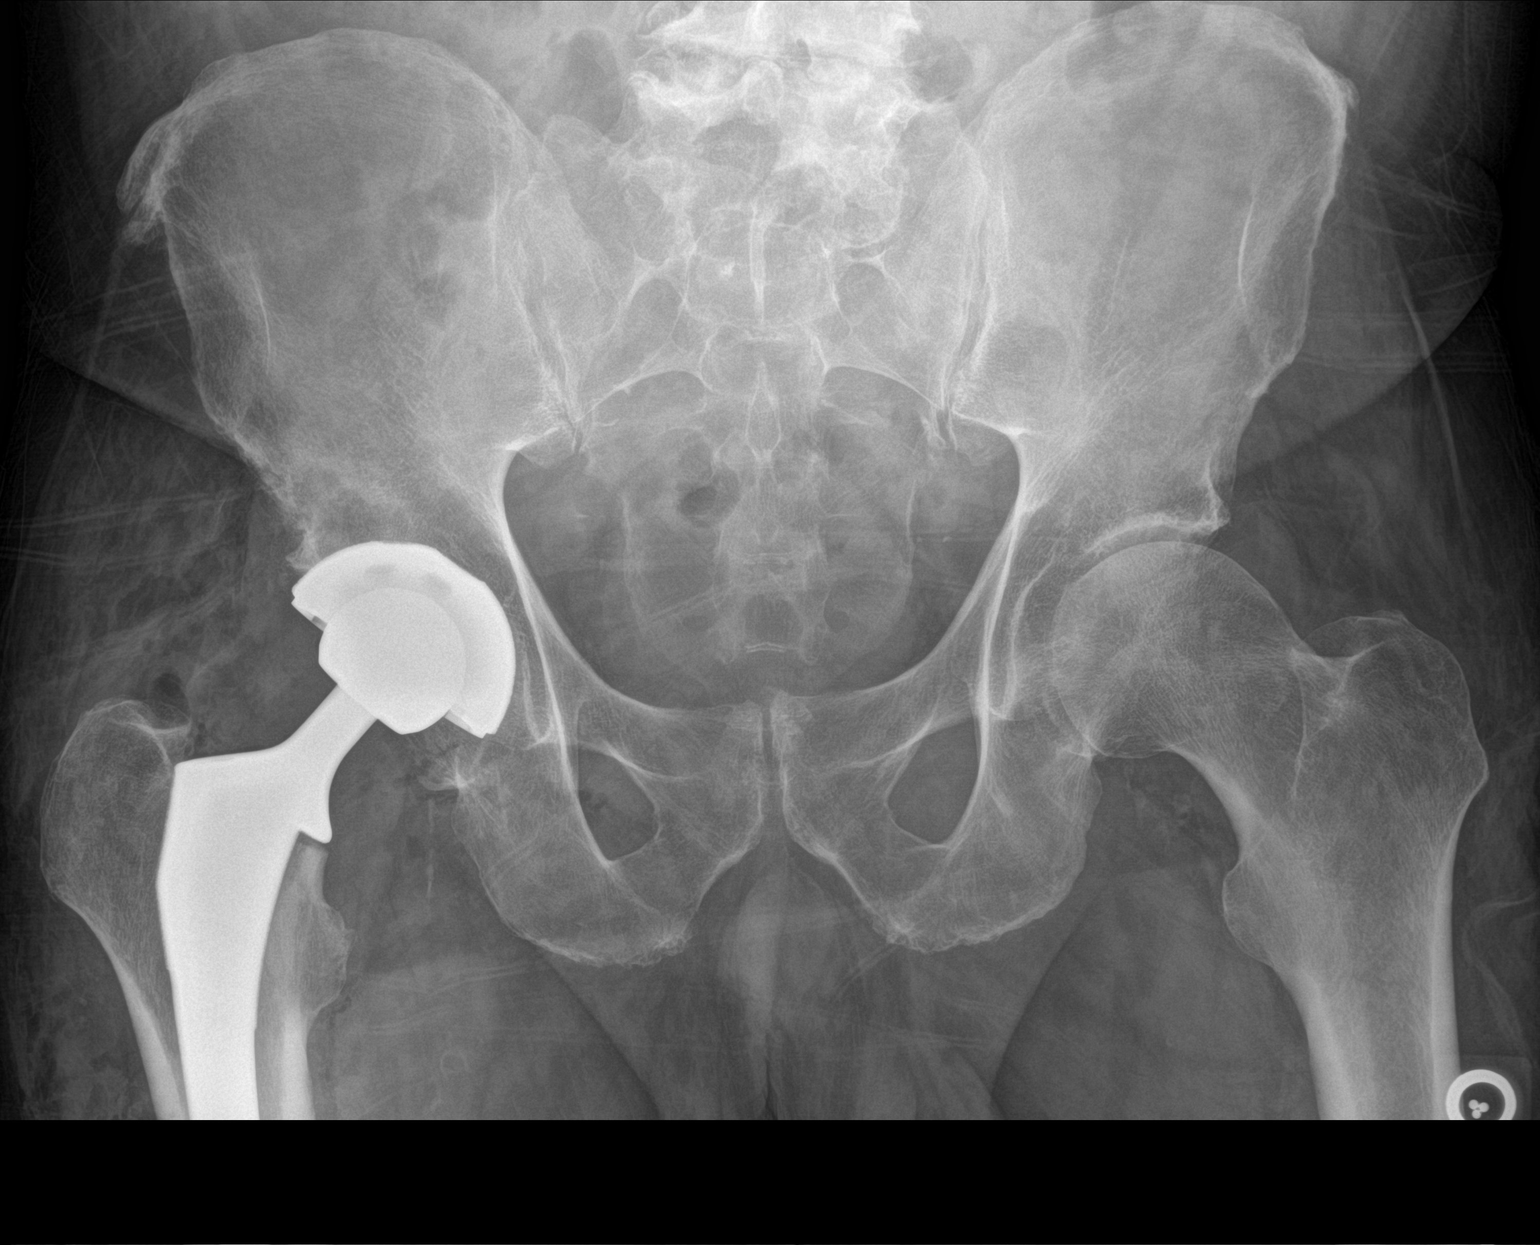

[pelvis ap (2 of 2)]
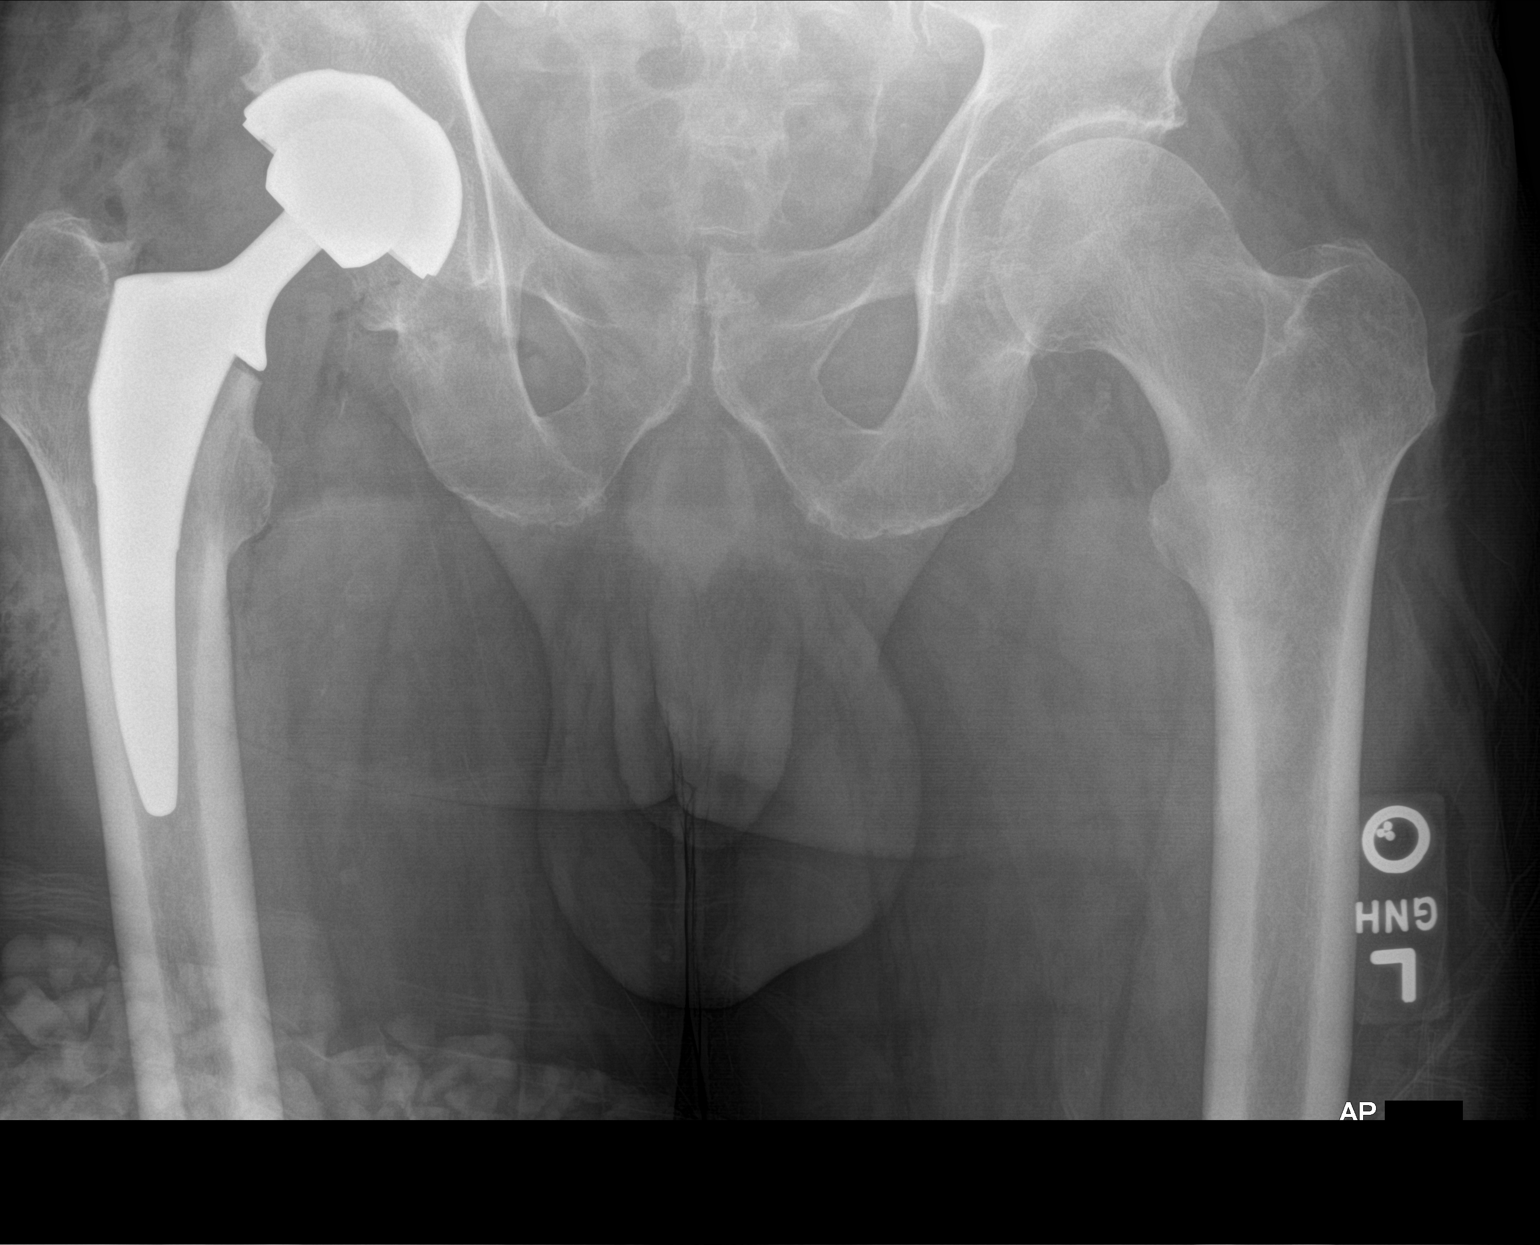

[2 of 2 positions shown; findings below may reference images not displayed]

FINDINGS: AP view of the pelvis demonstrates right total hip arthroplasty.
Hardware is in its expected alignment without periprosthetic
fracture. Advanced spondylosis at the lumbosacral junction. Mild
bilateral SI joint OA. Expected postoperative changes within the
soft tissues about the right hip.
IMPRESSION: Interval postsurgical changes from right total hip arthroplasty
without evidence of immediate postoperative complication.

## 2020-07-23 IMAGING — RF OPERATIVE RIGHT HIP WITH PELVIS
1 series · 2 of 2 positions shown · non-contrast
Comparison: None.

CLINICAL DATA: Right hip replacement

EXAM:
OPERATIVE RIGHT HIP (WITH PELVIS IF PERFORMED) 2 VIEWS
TECHNIQUE: Fluoroscopic spot image(s) were submitted for interpretation
post-operatively.
FLUOROSCOPY TIME:  28 seconds
4.95 mGy

[Series 1: run · 2 of 2 slices shown]
[im 1/2]
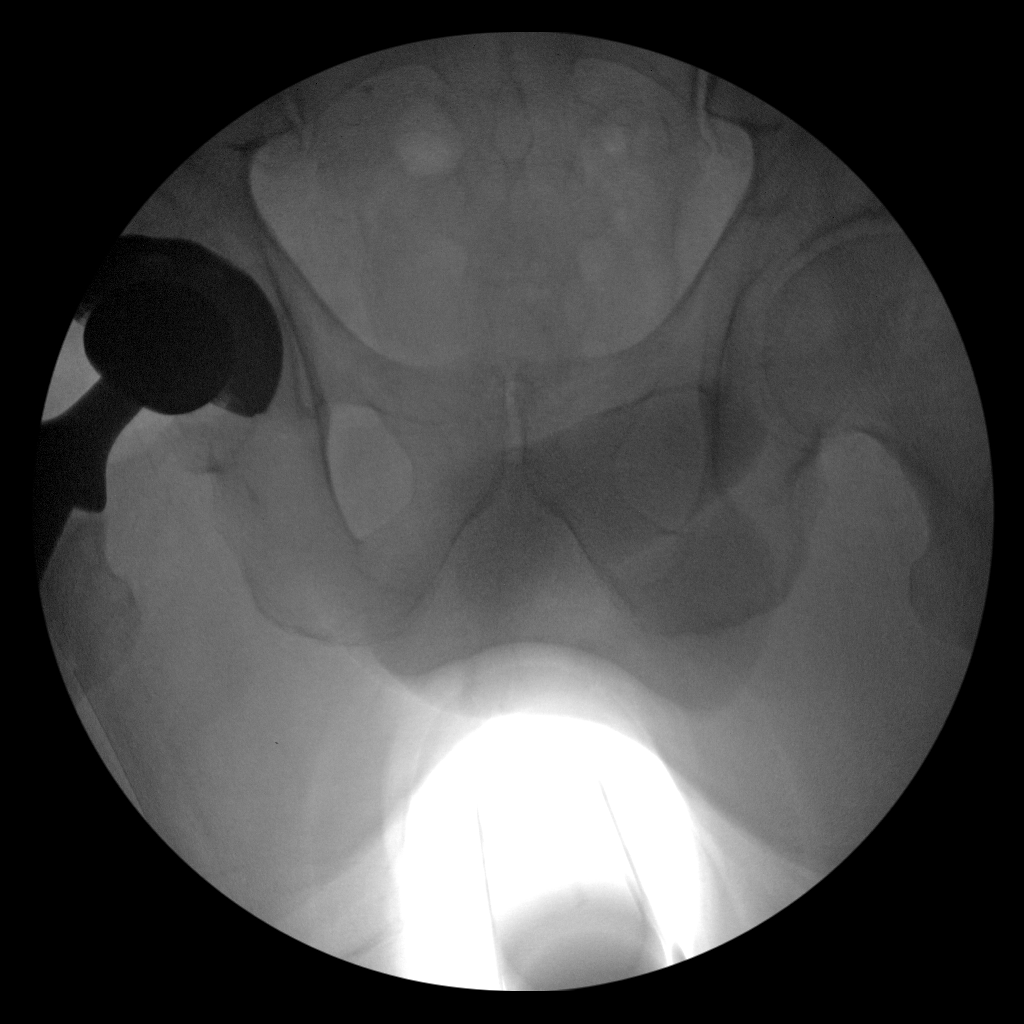
[im 2/2]
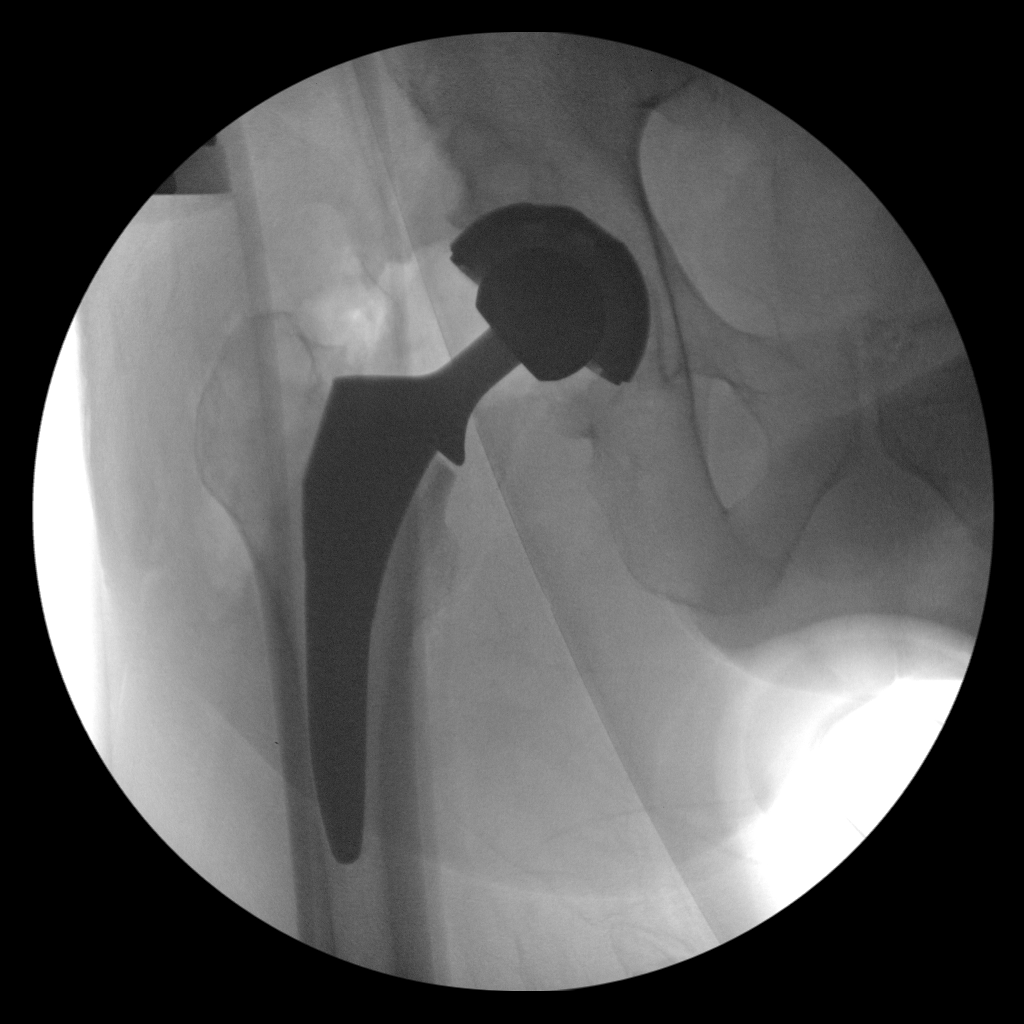

[2 of 2 positions shown; findings below may reference images not displayed]

FINDINGS: Intraoperative fluoroscopic spot image. Right total hip
arthroplasty.
IMPRESSION: Interval right total hip arthroplasty.
